# Patient Record
Sex: Female | Born: 1985 | Race: Black or African American | Hispanic: No | Marital: Single | Smoking: Never smoker
Health system: Southern US, Community
[De-identification: ages and names within clinical notes are randomized; demographics above are authoritative.]

## PROBLEM LIST (undated history)

## (undated) HISTORY — PX: WISDOM TOOTH EXTRACTION: SHX21

## (undated) HISTORY — PX: NO PAST SURGERIES: SHX2092

---
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
## 2008-07-01 ENCOUNTER — Emergency Department: Payer: Self-pay | Admitting: Emergency Medicine

## 2009-05-02 ENCOUNTER — Observation Stay: Payer: Self-pay

## 2009-05-03 ENCOUNTER — Ambulatory Visit: Payer: Self-pay

## 2009-07-24 ENCOUNTER — Observation Stay: Payer: Self-pay

## 2009-08-08 ENCOUNTER — Inpatient Hospital Stay: Payer: Self-pay

## 2017-06-26 ENCOUNTER — Ambulatory Visit
Admission: EM | Admit: 2017-06-26 | Discharge: 2017-06-26 | Disposition: A | Discharge status: Home / Self Care | Payer: BLUE CROSS/BLUE SHIELD | Attending: Family Medicine | Admitting: Family Medicine

## 2017-06-26 DIAGNOSIS — N3090 Cystitis, unspecified without hematuria: Secondary | ICD-10-CM | POA: Diagnosis not present

## 2017-06-26 DIAGNOSIS — N390 Urinary tract infection, site not specified: Secondary | ICD-10-CM

## 2017-06-26 LAB — URINALYSIS, COMPLETE (UACMP) WITH MICROSCOPIC
BILIRUBIN URINE: NEGATIVE
Glucose, UA: NEGATIVE mg/dL
HGB URINE DIPSTICK: NEGATIVE
KETONES UR: NEGATIVE mg/dL
NITRITE: NEGATIVE
PH: 6 (ref 5.0–8.0)
Protein, ur: NEGATIVE mg/dL
Specific Gravity, Urine: <1.005 — ABNORMAL LOW (ref 1.005–1.030)

## 2017-06-26 MED ORDER — PHENAZOPYRIDINE HCL 200 MG PO TABS: 200.0000 mg | ORAL_TABLET | Freq: Three times a day (TID) | ORAL | 0 refills | Status: DC | PRN

## 2017-06-26 MED ORDER — FLUCONAZOLE 150 MG PO TABS: 150.0000 mg | ORAL_TABLET | Freq: Once | ORAL | 0 refills | Status: AC

## 2017-06-26 MED ORDER — NITROFURANTOIN MONOHYD MACRO 100 MG PO CAPS: 100.0000 mg | ORAL_CAPSULE | Freq: Two times a day (BID) | ORAL | 0 refills | Status: DC

## 2017-06-26 NOTE — ED Provider Notes (Signed)
[facility]    CSN: [id number] Arrival date & time: 06/26/17  0802     History   Chief Complaint Chief Complaint  Patient presents with  . Urinary Frequency    HPI Alisha Morales is a 32 y.o. female.   Patient is a 32 year old black female also having dysuria no abdominal pain since Friday. She reports that years ago she was getting UTIs quite often but then she hadn't had any trouble until recently. She went to GYN on Monday and was cleared for chlamydia and GC and any other type of significant vaginal irritation and inflammation but did not check her urine. She reports some burning urination and frequency. Does not smoke. No known drug allergies habits no significant past family medical history other than hypertension. No previous surgeries.   The history is provided by the patient. No language interpreter was used.  Urinary Frequency  This is a new problem. The current episode started more than 2 days ago. The problem occurs constantly. The problem has been gradually worsening. Pertinent negatives include no chest pain, no abdominal pain, no headaches and no shortness of breath. Nothing aggravates the symptoms. Nothing relieves the symptoms. She has tried nothing for the symptoms. The treatment provided no relief.    History reviewed. No pertinent past medical history.  There are no active problems to display for this patient.   Past Surgical History:  Procedure Laterality Date  . NO PAST SURGERIES      OB History    No data available       Home Medications    Prior to Admission medications   Not on File    Family History History reviewed. No pertinent family history.  Social History Social History  Substance Use Topics  . Smoking status: Never Smoker  . Smokeless tobacco: Never Used  . Alcohol use No     Allergies   Patient has no known allergies.   Review of Systems Review of Systems  Respiratory: Negative for shortness of breath.    Cardiovascular: Negative for chest pain.  Gastrointestinal: Negative for abdominal pain.  Genitourinary: Positive for dysuria and frequency.  Neurological: Negative for headaches.  All other systems reviewed and are negative.    Physical Exam Triage Vital Signs ED Triage Vitals  Enc Vitals Group     BP 06/26/17 0820 (!) 144/86     Pulse Rate 06/26/17 0820 98     Resp 06/26/17 0820 18     Temp 06/26/17 0820 98.7 F (37.1 C)     Temp Source 06/26/17 0820 Oral     SpO2 06/26/17 0820 100 %     Weight 06/26/17 0817 250 lb (113.4 kg)     Height 06/26/17 0817 5\' 2"  (1.575 m)     Head Circumference --      Peak Flow --      Pain Score 06/26/17 0817 5     Pain Loc --      Pain Edu? --      Excl. in GC? --    No data found.   Updated Vital Signs BP (!) 144/86 (BP Location: Left Arm)   Pulse 98   Temp 98.7 F (37.1 C) (Oral)   Resp 18   Ht 5\' 2"  (1.575 m)   Wt 250 lb (113.4 kg)   LMP 06/11/2017   SpO2 100%   BMI 45.73 kg/m   Visual Acuity Right Eye Distance:   Left Eye Distance:   Bilateral Distance:  Right Eye Near:   Left Eye Near:    Bilateral Near:     Physical Exam  Constitutional: She is oriented to person, place, and time. She appears well-developed and well-nourished. No distress.  HENT:  Head: Normocephalic and atraumatic.  Right Ear: External ear normal.  Left Ear: External ear normal.  Eyes: Pupils are equal, round, and reactive to light.  Neck: Normal range of motion.  Pulmonary/Chest: Effort normal.  Abdominal: Soft. Bowel sounds are normal. She exhibits no distension. There is no tenderness.  Musculoskeletal: Normal range of motion.  Neurological: She is alert and oriented to person, place, and time.  Skin: Skin is warm. She is not diaphoretic.     UC Treatments / Results  Labs (all labs ordered are listed, but only abnormal results are displayed) Labs Reviewed  URINALYSIS, COMPLETE (UACMP) WITH MICROSCOPIC - Abnormal; Notable for the  following:       Result Value   Specific Gravity, Urine <1.005 (*)    Leukocytes, UA TRACE (*)    Squamous Epithelial / LPF 0-5 (*)    Bacteria, UA FEW (*)    All other components within normal limits    EKG  EKG Interpretation None       Radiology No results found.  Procedures Procedures (including critical care time)  Medications Ordered in UC Medications - No data to display  Results for orders placed or performed during the hospital encounter of 06/26/17  Urinalysis, Complete w Microscopic  Result Value Ref Range   Color, Urine YELLOW YELLOW   APPearance CLEAR CLEAR   Specific Gravity, Urine <1.005 (L) 1.005 - 1.030   pH 6.0 5.0 - 8.0   Glucose, UA NEGATIVE NEGATIVE mg/dL   Hgb urine dipstick NEGATIVE NEGATIVE   Bilirubin Urine NEGATIVE NEGATIVE   Ketones, ur NEGATIVE NEGATIVE mg/dL   Protein, ur NEGATIVE NEGATIVE mg/dL   Nitrite NEGATIVE NEGATIVE   Leukocytes, UA TRACE (A) NEGATIVE   Squamous Epithelial / LPF 0-5 (A) NONE SEEN   WBC, UA 6-30 0 - 5 WBC/hpf   RBC / HPF 0-5 0 - 5 RBC/hpf   Bacteria, UA FEW (A) NONE SEEN    Initial Impression / Assessment and Plan / UC Course  I have reviewed the triage vital signs and the nursing notes.  Pertinent labs & imaging results that were available during my care of the patient were reviewed by me and considered in my medical decision making (see chart for details).   we'll place her on Macrobid 100 mg twice a day Diflucan if needed for yeast Pyridium 200 mg 2 times a day for the next 5 days she declines a work note pop PCP as needed  Final Clinical Impressions(s) / UC Diagnoses   Final diagnoses:  Lower urinary tract infectious disease  Cystitis    New Prescriptions New Prescriptions   No medications on file     Note: This dictation was prepared with Dragon dictation along with smaller phrase technology. Any transcriptional errors that result from this process are unintentional.   Hassan Rowan, MD 06/26/17  [phone]

## 2017-06-26 NOTE — ED Triage Notes (Signed)
Patient complains of urgency with output, burning with urination. Patient states that she went to gyn on Monday and was tested for yeast and stds and all was normal. Patient reports that they did not test her urine and she has been having symptoms since Friday.

## 2017-06-28 LAB — URINE CULTURE
Culture: 40000 — AB
Special Requests: NORMAL

## 2017-07-22 ENCOUNTER — Encounter: Payer: Self-pay | Admitting: Emergency Medicine

## 2017-07-22 ENCOUNTER — Ambulatory Visit
Admission: EM | Admit: 2017-07-22 | Discharge: 2017-07-22 | Disposition: A | Discharge status: Home / Self Care | Payer: BLUE CROSS/BLUE SHIELD | Attending: Family Medicine | Admitting: Family Medicine

## 2017-07-22 DIAGNOSIS — R109 Unspecified abdominal pain: Secondary | ICD-10-CM | POA: Diagnosis not present

## 2017-07-22 DIAGNOSIS — R197 Diarrhea, unspecified: Secondary | ICD-10-CM | POA: Diagnosis not present

## 2017-07-22 MED ORDER — METRONIDAZOLE 500 MG PO TABS: 500.0000 mg | ORAL_TABLET | Freq: Three times a day (TID) | ORAL | 0 refills | Status: DC

## 2017-07-22 NOTE — ED Provider Notes (Signed)
[facility]    CSN: [id number] Arrival date & time: 07/22/17  1403     History   Chief Complaint Chief Complaint  Patient presents with  . Abdominal Pain  . Diarrhea    HPI Alisha Morales is a 32 y.o. female.   32 yo female with a c/o watery, mucousy,  diarrhea and abdominal cramping for 2 weeks. States symptoms started after she finished an antibiotic course for a UTI. Denies any fevers, chills, vomiting, nausea.    The history is provided by the patient.  Abdominal Pain  Associated symptoms: diarrhea   Diarrhea  Associated symptoms: abdominal pain     History reviewed. No pertinent past medical history.  There are no active problems to display for this patient.   Past Surgical History:  Procedure Laterality Date  . NO PAST SURGERIES      OB History    No data available       Home Medications    Prior to Admission medications   Medication Sig Start Date End Date Taking? Authorizing Provider  metroNIDAZOLE (FLAGYL) 500 MG tablet Take 1 tablet (500 mg total) by mouth 3 (three) times daily. 07/22/17   Payton Mccallum, MD    Family History History reviewed. No pertinent family history.  Social History Social History  Substance Use Topics  . Smoking status: Never Smoker  . Smokeless tobacco: Never Used  . Alcohol use No     Allergies   Patient has no known allergies.   Review of Systems Review of Systems  Gastrointestinal: Positive for abdominal pain and diarrhea.     Physical Exam Triage Vital Signs ED Triage Vitals  Enc Vitals Group     BP 07/22/17 1413 137/82     Pulse Rate 07/22/17 1413 (!) 105     Resp 07/22/17 1413 18     Temp 07/22/17 1413 98.2 F (36.8 C)     Temp Source 07/22/17 1413 Oral     SpO2 07/22/17 1413 100 %     Weight 07/22/17 1411 250 lb (113.4 kg)     Height 07/22/17 1411 5\' 2"  (1.575 m)     Head Circumference --      Peak Flow --      Pain Score 07/22/17 1411 3     Pain Loc --      Pain Edu? --        Excl. in GC? --    No data found.   Updated Vital Signs BP 137/82 (BP Location: Left Arm)   Pulse (!) 105   Temp 98.2 F (36.8 C) (Oral)   Resp 18   Ht 5\' 2"  (1.575 m)   Wt 250 lb (113.4 kg)   LMP 07/02/2017 (Exact Date)   SpO2 100%   BMI 45.73 kg/m   Visual Acuity Right Eye Distance:   Left Eye Distance:   Bilateral Distance:    Right Eye Near:   Left Eye Near:    Bilateral Near:     Physical Exam  Constitutional: She appears well-developed and well-nourished. No distress.  Pulmonary/Chest: Effort normal. No respiratory distress.  Abdominal: Soft. Bowel sounds are normal. She exhibits no distension and no mass. There is no tenderness. There is no rebound and no guarding.  Skin: She is not diaphoretic.  Nursing note and vitals reviewed.    UC Treatments / Results  Labs (all labs ordered are listed, but only abnormal results are displayed) Labs Reviewed - No data to display  EKG  EKG Interpretation None       Radiology No results found.  Procedures Procedures (including critical care time)  Medications Ordered in UC Medications - No data to display   Initial Impression / Assessment and Plan / UC Course  I have reviewed the triage vital signs and the nursing notes.  Pertinent labs & imaging results that were available during my care of the patient were reviewed by me and considered in my medical decision making (see chart for details).       Final Clinical Impressions(s) / UC Diagnoses   Final diagnoses:  Diarrhea, unspecified type    New Prescriptions Discharge Medication List as of 07/22/2017  2:49 PM    START taking these medications   Details  metroNIDAZOLE (FLAGYL) 500 MG tablet Take 1 tablet (500 mg total) by mouth 3 (three) times daily., Starting Mon 07/22/2017, Normal       1. diagnosis reviewed with patient 2. rx as per orders above; reviewed possible side effects, interactions, risks and benefits  3. Recommend supportive  treatment with clear liquids, then advance diet slowly as tolerated 4. Follow-up prn if symptoms worsen or don't improve   Payton Mccallumonty, Neosha Switalski, MD 07/22/17 [phone]

## 2017-07-22 NOTE — ED Triage Notes (Signed)
Patient c/o mid abdominal pain and diarrhea for the past 2 weeks.  Patient denies nausea or vomiting.

## 2017-07-22 NOTE — Discharge Instructions (Signed)
Clear liquids then advance diet slowly

## 2017-07-24 ENCOUNTER — Telehealth: Payer: Self-pay | Admitting: *Deleted

## 2017-07-24 MED ORDER — FLUCONAZOLE 150 MG PO TABS: 150.0000 mg | ORAL_TABLET | Freq: Once | ORAL | 0 refills | Status: AC

## 2017-07-25 ENCOUNTER — Encounter: Payer: Self-pay | Admitting: Emergency Medicine

## 2017-07-25 ENCOUNTER — Ambulatory Visit
Admission: EM | Admit: 2017-07-25 | Discharge: 2017-07-25 | Disposition: A | Discharge status: Home / Self Care | Payer: BLUE CROSS/BLUE SHIELD | Attending: Emergency Medicine | Admitting: Emergency Medicine

## 2017-07-25 DIAGNOSIS — Z113 Encounter for screening for infections with a predominantly sexual mode of transmission: Secondary | ICD-10-CM | POA: Diagnosis not present

## 2017-07-25 DIAGNOSIS — R300 Dysuria: Secondary | ICD-10-CM | POA: Diagnosis not present

## 2017-07-25 DIAGNOSIS — N898 Other specified noninflammatory disorders of vagina: Secondary | ICD-10-CM

## 2017-07-25 DIAGNOSIS — N760 Acute vaginitis: Secondary | ICD-10-CM | POA: Diagnosis not present

## 2017-07-25 DIAGNOSIS — L298 Other pruritus: Secondary | ICD-10-CM | POA: Diagnosis not present

## 2017-07-25 LAB — URINALYSIS, COMPLETE (UACMP) WITH MICROSCOPIC
Bilirubin Urine: NEGATIVE
GLUCOSE, UA: NEGATIVE mg/dL
Hgb urine dipstick: NEGATIVE
Ketones, ur: NEGATIVE mg/dL
Nitrite: NEGATIVE
PH: 6 (ref 5.0–8.0)
Protein, ur: NEGATIVE mg/dL
Specific Gravity, Urine: 1.015 (ref 1.005–1.030)

## 2017-07-25 LAB — WET PREP, GENITAL
CLUE CELLS WET PREP: NONE SEEN
SPERM: NONE SEEN
Trich, Wet Prep: NONE SEEN
Yeast Wet Prep HPF POC: NONE SEEN

## 2017-07-25 LAB — CHLAMYDIA/NGC RT PCR (ARMC ONLY)
CHLAMYDIA TR: NOT DETECTED
N GONORRHOEAE: NOT DETECTED

## 2017-07-25 MED ORDER — NYSTATIN-TRIAMCINOLONE 100000-0.1 UNIT/GM-% EX CREA: TOPICAL_CREAM | CUTANEOUS | 0 refills | Status: DC

## 2017-07-25 MED ORDER — FLUCONAZOLE 150 MG PO TABS: 150.0000 mg | ORAL_TABLET | Freq: Once | ORAL | 1 refills | Status: AC

## 2017-07-25 NOTE — ED Triage Notes (Signed)
Patient c/o vaginal itching and discharge for the past 3 days.

## 2017-07-25 NOTE — ED Provider Notes (Signed)
HPI  SUBJECTIVE:  Alisha ChihuahuaShannah L Cousin is a 32 y.o. female who presents with 3 days of vaginal itching, burning starting the same day that she was started on Flagyl for diarrhea. She reports nonodorous yellow vaginal discharge, dysuria. She had Diflucan called in Yesterday and states that she took it without improvement in her symptoms. She also tried Vagisil anti-itch cream which is 20% benzocaine with improvement in symptoms. Symptoms are worse with urination. No nausea, vomiting, fevers, genital rash, odor, pelvic, abdominal, back pain. She is in a monogamous relationship with a female who is asymptomatic, but patient is requesting testing for all STDs today. No dyspareunia. She is using [place] lavender soap, which is new. She has a past medical history Chlamydia, BV, yeast. No history of diabetes, hypertension, gonorrhea, HIV, HSV, syphilis, Trichomonas. LMP: 6/9. States that we do not need to check for pregnancy. [facility]   Patient was seen on 7/23 for 2 week bout of diarrhea after finishing an antibiotic course for UTI. Sent home with Flagyl. States she is on day #3 of this.    Past Surgical History:  Procedure Laterality Date  . NO PAST SURGERIES      History reviewed. No pertinent family history.  Social History  Substance Use Topics  . Smoking status: Never Smoker  . Smokeless tobacco: Never Used  . Alcohol use No    No current facility-administered medications for this encounter.   Current Outpatient Prescriptions:  .  fluconazole (DIFLUCAN) 150 MG tablet, Take 1 tablet (150 mg total) by mouth once. 1 tab po x 1. May repeat in 72 hours if no improvement, Disp: 2 tablet, Rfl: 1 .  metroNIDAZOLE (FLAGYL) 500 MG tablet, Take 1 tablet (500 mg total) by mouth 3 (three) times daily., Disp: 30 tablet, Rfl: 0 .  nystatin-triamcinolone (MYCOLOG II) cream, Apply to affected area daily, Disp: 15 g, Rfl: 0  No Known Allergies   ROS  As noted in HPI.   Physical  Exam  BP (!) 141/89 (BP Location: Left Arm)   Pulse 92   Temp 98.6 F (37 C) (Oral)   Resp 16   Ht 5\' 2"  (1.575 m)   Wt 250 lb (113.4 kg)   LMP 07/02/2017 (Exact Date)   SpO2 100%   BMI 45.73 kg/m   Constitutional: Well developed, well nourished, no acute distress Eyes:  EOMI, conjunctiva normal bilaterally HENT: Normocephalic, atraumatic,mucus membranes moist Respiratory: Normal inspiratory effort Cardiovascular: Normal rate GI: nondistended soft, nontender. No suprapubic tenderness  back: No CVA tenderness GU: External genitalia normal. No blisters.  Normal vaginal mucosa.  Normal os. Thin nonoderous  Yellowish vaginal discharge.  Uterus smooth, NT. No  CMT. No  adnexal tenderness. No adnexal masses.  Chaperone present during exam skin: No rash, skin intact Musculoskeletal: no deformities Neurologic: Alert & oriented x 3, no focal neuro deficits Psychiatric: Speech and behavior appropriate   ED Course   Medications - No data to display  Orders Placed This Encounter  Procedures  . Chlamydia/NGC rt PCR    Standing Status:   Standing    Number of Occurrences:   1    Order Specific Question:   Patient immune status    Answer:   Normal  . Wet prep, genital    Standing Status:   Standing    Number of Occurrences:   1    Order Specific Question:   Patient immune status    Answer:   Normal  . Urine  culture    Standing Status:   Standing    Number of Occurrences:   1    Order Specific Question:   Patient immune status    Answer:   Normal  . RPR    Standing Status:   Standing    Number of Occurrences:   1  . HIV antibody    Standing Status:   Standing    Number of Occurrences:   1  . Urinalysis, Complete w Microscopic    Standing Status:   Standing    Number of Occurrences:   1    Results for orders placed or performed during the hospital encounter of 07/25/17 (from the past 24 hour(s))  Urinalysis, Complete w Microscopic     Status: Abnormal   Collection Time:  07/25/17  3:16 PM  Result Value Ref Range   Color, Urine YELLOW YELLOW   APPearance CLEAR CLEAR   Specific Gravity, Urine 1.015 1.005 - 1.030   pH 6.0 5.0 - 8.0   Glucose, UA NEGATIVE NEGATIVE mg/dL   Hgb urine dipstick NEGATIVE NEGATIVE   Bilirubin Urine NEGATIVE NEGATIVE   Ketones, ur NEGATIVE NEGATIVE mg/dL   Protein, ur NEGATIVE NEGATIVE mg/dL   Nitrite NEGATIVE NEGATIVE   Leukocytes, UA SMALL (A) NEGATIVE   Squamous Epithelial / LPF 6-30 (A) NONE SEEN   WBC, UA 6-30 0 - 5 WBC/hpf   RBC / HPF 0-5 0 - 5 RBC/hpf   Bacteria, UA FEW (A) NONE SEEN  Wet prep, genital     Status: Abnormal   Collection Time: 07/25/17  3:16 PM  Result Value Ref Range   Yeast Wet Prep HPF POC NONE SEEN NONE SEEN   Trich, Wet Prep NONE SEEN NONE SEEN   Clue Cells Wet Prep HPF POC NONE SEEN NONE SEEN   WBC, Wet Prep HPF POC TOO NUMEROUS TO COUNT (A) NONE SEEN   Sperm NONE SEEN    No results found.  ED Clinical Impression  Vaginal itching  Acute vaginitis   ED Assessment/Plan  No previous labs available.  No evidence of herpes on today's exam although this is in the differential. Think that this is most likely a yeast infection or could be a contact irritant dermatitis from the new soap.  UA, wet prep for today,    UA contaminated specimen, but she has small leukocytes, feel that her dysuria is more from vulvar irritation rather than a true UTI. We'll send this off for culture to confirm absence of UTI.  Wet prep positive for many WBCs, no BV, yeast, Trichomonas.  Despite wet prep results, due to the recent antibiotic and the itching, we will send her home with nystatin/triamcinolone Mycolog cream in addition to another Diflucan, may repeat 72 hours if no better. She will finish the Flagyl.  Sent off GC/chlamydia,  HIV, RPR. Will not treat empirically now due to recent diarrhea from antibiotics. Advised pt to refrain from sexual contact until she knows lab results, symptoms resolve, and  partner(s) are treated if necessary. Pt provided working phone number. Follow-up with PMD as needed. Discussed labs, MDM, plan and followup with patient. Pt agrees with plan.   Meds ordered this encounter  Medications  . fluconazole (DIFLUCAN) 150 MG tablet    Sig: Take 1 tablet (150 mg total) by mouth once. 1 tab po x 1. May repeat in 72 hours if no improvement    Dispense:  2 tablet    Refill:  1  . nystatin-triamcinolone (MYCOLOG II)  cream    Sig: Apply to affected area daily    Dispense:  15 g    Refill:  0    *This clinic note was created using Scientist, clinical (histocompatibility and immunogenetics)Dragon dictation software. Therefore, there may be occasional mistakes despite careful proofreading.  ?    Domenick GongMortenson, Raygen Dahm, MD 07/25/17 [phone]

## 2017-07-25 NOTE — Discharge Instructions (Signed)
Take the medication as written. Give us a working phone number so that we can contact you if needed. Refrain from sexual contact until you know your results and your partner(s) are treated if necessary.  Return if you get worse, have a fever >100.4, or for any concerns.   Go to www.goodrx.com to look up your medications. This will give you a list of where you can find your prescriptions at the most affordable prices. Or ask the pharmacist what the cash price is, or if they have any other discount programs available to help make your medication more affordable. This can be less expensive than what you would pay with insurance.

## 2017-07-26 LAB — HIV ANTIBODY (ROUTINE TESTING W REFLEX): HIV SCREEN 4TH GENERATION: NONREACTIVE

## 2017-07-26 LAB — RPR: RPR: NONREACTIVE

## 2017-07-27 LAB — URINE CULTURE: SPECIAL REQUESTS: NORMAL

## 2017-11-14 ENCOUNTER — Ambulatory Visit
Admission: EM | Admit: 2017-11-14 | Discharge: 2017-11-14 | Disposition: A | Discharge status: Home / Self Care | Payer: BLUE CROSS/BLUE SHIELD | Attending: Family Medicine | Admitting: Family Medicine

## 2017-11-14 ENCOUNTER — Other Ambulatory Visit: Payer: Self-pay

## 2017-11-14 DIAGNOSIS — R319 Hematuria, unspecified: Secondary | ICD-10-CM

## 2017-11-14 DIAGNOSIS — R350 Frequency of micturition: Secondary | ICD-10-CM

## 2017-11-14 DIAGNOSIS — R300 Dysuria: Secondary | ICD-10-CM

## 2017-11-14 DIAGNOSIS — N390 Urinary tract infection, site not specified: Secondary | ICD-10-CM

## 2017-11-14 LAB — URINALYSIS, COMPLETE (UACMP) WITH MICROSCOPIC
BACTERIA UA: NONE SEEN
Bilirubin Urine: NEGATIVE
Glucose, UA: NEGATIVE mg/dL
Ketones, ur: NEGATIVE mg/dL
NITRITE: NEGATIVE
PH: 6 (ref 5.0–8.0)
Protein, ur: NEGATIVE mg/dL
RBC / HPF: NONE SEEN RBC/hpf (ref 0–5)

## 2017-11-14 MED ORDER — SULFAMETHOXAZOLE-TRIMETHOPRIM 800-160 MG PO TABS: 1.0000 | ORAL_TABLET | Freq: Two times a day (BID) | ORAL | 0 refills | Status: AC

## 2017-11-14 MED ORDER — FLUCONAZOLE 200 MG PO TABS: 200.0000 mg | ORAL_TABLET | Freq: Once | ORAL | 0 refills | Status: AC

## 2017-11-14 NOTE — ED Provider Notes (Signed)
[facility]    CSN: [id number] Arrival date & time: 11/14/17  1532     History   Chief Complaint Chief Complaint  Patient presents with  . Urinary Frequency    HPI Alisha Morales is a 32 y.o. female.   Patient is a 32 year old female who presents with complaint of burning with urination, frequency, urgency, and some pink coloration to her urine that started yesterday. Patient reports burning his 20 in the upper urination. She denies fever chills. She is not taking medications for this. Patient did have a UTI back in July that was treated with Macrobid which cause some diarrhea afterwards,      History reviewed. No pertinent past medical history.  There are no active problems to display for this patient.   Past Surgical History:  Procedure Laterality Date  . NO PAST SURGERIES      OB History    No data available       Home Medications    Prior to Admission medications   Medication Sig Start Date End Date Taking? Authorizing Provider  fluconazole (DIFLUCAN) 200 MG tablet Take 1 tablet (200 mg total) once for 1 dose by mouth. 11/14/17 11/14/17  Candis SchatzHarris, Yeslin Delio D, PA-C  sulfamethoxazole-trimethoprim (BACTRIM DS,SEPTRA DS) 800-160 MG tablet Take 1 tablet 2 (two) times daily for 5 days by mouth. 11/14/17 11/19/17  Candis SchatzHarris, Donnica Jarnagin D, PA-C    Family History Family History  Problem Relation Age of Onset  . Stroke Mother   . Diabetes Father   . Hypercalcemia Father   . Hypertension Father     Social History Social History   Tobacco Use  . Smoking status: Never Smoker  . Smokeless tobacco: Never Used  Substance Use Topics  . Alcohol use: No  . Drug use: No     Allergies   Patient has no known allergies.   Review of Systems Review of Systems  -As noted above in history of present illness. Other systems reviewed and found to be negative.   Physical Exam Triage Vital Signs ED Triage Vitals  Enc Vitals Group     BP 11/14/17 1610 (!)  144/99     Pulse Rate 11/14/17 1610 77     Resp 11/14/17 1610 18     Temp 11/14/17 1610 98.7 F (37.1 C)     Temp Source 11/14/17 1610 Oral     SpO2 11/14/17 1610 100 %     Weight 11/14/17 1606 250 lb (113.4 kg)     Height 11/14/17 1606 5\' 2"  (1.575 m)     Head Circumference --      Peak Flow --      Pain Score 11/14/17 1606 9     Pain Loc --      Pain Edu? --      Excl. in GC? --    No data found.  Updated Vital Signs BP (!) 144/99 (BP Location: Left Arm)   Pulse 77   Temp 98.7 F (37.1 C) (Oral)   Resp 18   Ht 5\' 2"  (1.575 m)   Wt 250 lb (113.4 kg)   LMP 10/25/2017   SpO2 100%   BMI 45.73 kg/m   Visual Acuity Right Eye Distance:   Left Eye Distance:   Bilateral Distance:    Right Eye Near:   Left Eye Near:    Bilateral Near:     Physical Exam  Constitutional: She is oriented to person, place, and time. She appears well-developed and well-nourished.  No distress.  HENT:  Head: Normocephalic and atraumatic.  Eyes: EOM are normal. Pupils are equal, round, and reactive to light.  Neck: Normal range of motion. Neck supple.  Cardiovascular: Normal rate, regular rhythm and intact distal pulses. Exam reveals no friction rub.  No murmur heard. Pulmonary/Chest: Breath sounds normal. No respiratory distress.  Abdominal: Bowel sounds are normal. There is no tenderness (no CVA tenderness).  Musculoskeletal: Normal range of motion.  Neurological: She is alert and oriented to person, place, and time. A cranial nerve deficit is present.  Skin: Skin is warm and dry.     UC Treatments / Results  Labs (all labs ordered are listed, but only abnormal results are displayed) Labs Reviewed  URINALYSIS, COMPLETE (UACMP) WITH MICROSCOPIC - Abnormal; Notable for the following components:      Result Value   Specific Gravity, Urine <1.005 (*)    Hgb urine dipstick TRACE (*)    Leukocytes, UA TRACE (*)    Squamous Epithelial / LPF 0-5 (*)    All other components within normal  limits  URINE CULTURE    EKG  EKG Interpretation None       Radiology No results found.  Procedures Procedures (including critical care time)  Medications Ordered in UC Medications - No data to display   Initial Impression / Assessment and Plan / UC Course  I have reviewed the triage vital signs and the nursing notes.  Pertinent labs & imaging results that were available during my care of the patient were reviewed by me and considered in my medical decision making (see chart for details).    UA with possible UTI. Will place her on a prescription of Bactrim. We'll send a culture. Also give her Diflucan that she can take as needed. Also recommend her start taking a probiotic based on her diarrhea after Macrobid earlier this summer.  Final Clinical Impressions(s) / UC Diagnoses   Final diagnoses:  Urinary tract infection with hematuria, site unspecified    ED Discharge Orders        Ordered    sulfamethoxazole-trimethoprim (BACTRIM DS,SEPTRA DS) 800-160 MG tablet  2 times daily     11/14/17 1647    fluconazole (DIFLUCAN) 200 MG tablet   Once     11/14/17 1649       Controlled Substance Prescriptions [place] Controlled Substance Registry consulted? Not Applicable   Candis SchatzHarris, Soriya Worster D, PA-C 11/14/17 1650

## 2017-11-14 NOTE — Discharge Instructions (Signed)
-  bactrim: one tablet twice a day for 5 days -Diflucan: one tablet once if needed for yeast infection -push fluids -given diarrhea with previous antibiotic, recommend over the counter probiotic to help avoid the diarrhea -return to clinic if symptoms worsen or not improve

## 2017-11-14 NOTE — ED Triage Notes (Signed)
Patient complains of urinary urgency, frequency, burning with urination, hematuria x yesterday.

## 2020-11-14 ENCOUNTER — Inpatient Hospital Stay: Payer: BC Managed Care – PPO

## 2020-11-14 ENCOUNTER — Inpatient Hospital Stay: Payer: BC Managed Care – PPO | Admitting: Oncology

## 2020-11-14 NOTE — Progress Notes (Deleted)
   Hematology/Oncology Consult note [facility] [phone] [phone]  Patient Care Team: Inc, [facility] as PCP - General   Name of the patient: Alisha Morales  [id number]  [birth year]    Reason for referral-iron deficiency anemia   Referring physician-Dr. Larwance Sachs  Date of visit: 11/14/20   History of presenting illness- ***  ECOG PS- ***  Pain scale- ***   Review of systems- ROS  No Known Allergies  There are no problems to display for this patient.    No past medical history on file.   Past Surgical History:  Procedure Laterality Date  . NO PAST SURGERIES      Social History   Socioeconomic History  . Marital status: Single    Spouse name: Not on file  . Number of children: Not on file  . Years of education: Not on file  . Highest education level: Not on file  Occupational History  . Not on file  Tobacco Use  . Smoking status: Never Smoker  . Smokeless tobacco: Never Used  Vaping Use  . Vaping Use: Never used  Substance and Sexual Activity  . Alcohol use: No  . Drug use: No  . Sexual activity: Not on file  Other Topics Concern  . Not on file  Social History Narrative  . Not on file   Social Determinants of Health   Financial Resource Strain:   . Difficulty of Paying Living Expenses: Not on file  Food Insecurity:   . Worried About Programme researcher, broadcasting/film/video in the Last Year: Not on file  . Ran Out of Food in the Last Year: Not on file  Transportation Needs:   . Lack of Transportation (Medical): Not on file  . Lack of Transportation (Non-Medical): Not on file  Physical Activity:   . Days of Exercise per Week: Not on file  . Minutes of Exercise per Session: Not on file  Stress:   . Feeling of Stress : Not on file  Social Connections:   . Frequency of Communication with Friends and Family: Not on file  . Frequency of Social Gatherings with Friends and Family: Not on file  . Attends  Religious Services: Not on file  . Active Member of Clubs or Organizations: Not on file  . Attends Banker Meetings: Not on file  . Marital Status: Not on file  Intimate Partner Violence:   . Fear of Current or Ex-Partner: Not on file  . Emotionally Abused: Not on file  . Physically Abused: Not on file  . Sexually Abused: Not on file     Family History  Problem Relation Age of Onset  . Stroke Mother   . Diabetes Father   . Hypercalcemia Father   . Hypertension Father     No current outpatient medications on file.   Physical exam: There were no vitals filed for this visit. Physical Exam     No flowsheet data found. No flowsheet data found.  No images are attached to the encounter.  No results found.  Assessment and plan- Patient is a 35 y.o. female ***   Thank you for this kind referral and the opportunity to participate in the care of this  Patient   Visit Diagnosis No diagnosis found.  Dr. Owens Shark, MD, MPH [facility] [phone] 11/14/2020

## 2021-07-02 ENCOUNTER — Encounter: Payer: Self-pay | Admitting: Emergency Medicine

## 2021-07-02 ENCOUNTER — Other Ambulatory Visit: Payer: Self-pay

## 2021-07-02 ENCOUNTER — Emergency Department
Admission: EM | Admit: 2021-07-02 | Discharge: 2021-07-02 | Disposition: A | Discharge status: Home / Self Care | Payer: BC Managed Care – PPO | Attending: Emergency Medicine | Admitting: Emergency Medicine

## 2021-07-02 DIAGNOSIS — H66001 Acute suppurative otitis media without spontaneous rupture of ear drum, right ear: Secondary | ICD-10-CM | POA: Insufficient documentation

## 2021-07-02 DIAGNOSIS — I10 Essential (primary) hypertension: Secondary | ICD-10-CM | POA: Insufficient documentation

## 2021-07-02 DIAGNOSIS — H9201 Otalgia, right ear: Secondary | ICD-10-CM | POA: Diagnosis present

## 2021-07-02 MED ORDER — IBUPROFEN 800 MG PO TABS
800.0000 mg | ORAL_TABLET | Freq: Once | ORAL | Status: DC
  Filled 2021-07-02: qty 1

## 2021-07-02 MED ORDER — NAPROXEN 500 MG PO TABS: 500.0000 mg | ORAL_TABLET | Freq: Two times a day (BID) | ORAL | 0 refills | Status: DC

## 2021-07-02 MED ORDER — AMOXICILLIN-POT CLAVULANATE 875-125 MG PO TABS: 1.0000 | ORAL_TABLET | Freq: Two times a day (BID) | ORAL | 0 refills | Status: AC

## 2021-07-02 MED ORDER — HYDROCODONE-ACETAMINOPHEN 5-325 MG PO TABS: 1.0000 | ORAL_TABLET | Freq: Four times a day (QID) | ORAL | 0 refills | Status: AC | PRN

## 2021-07-02 NOTE — ED Triage Notes (Signed)
Pt comes into the ED via POV c/o right ear pain that started this morning.  Pt does admit that she had cold like symptoms earlier in the week but those have cleared up now.  PT in NAD at this time.

## 2021-07-02 NOTE — ED Notes (Signed)
Pt verbalizes understanding of d/c instructions, medications and follow up 

## 2021-07-02 NOTE — ED Provider Notes (Signed)
[facility] Emergency Department Provider Note ____________________________________________  Time seen: Approximately 7:37 AM  I have reviewed the triage vital signs and the nursing notes.   HISTORY  Chief Complaint Otalgia    HPI Alisha Morales is a 36 y.o. female presents to the emergency department for treatment and evaluation of otalgia that started early this morning.  She had had some cold symptoms that have resolved completely but has developed severe right ear pain.  No known fever.  No alleviating measures attempted prior to arrival. History reviewed. No pertinent past medical history.  There are no problems to display for this patient.   Past Surgical History:  Procedure Laterality Date   NO PAST SURGERIES      Prior to Admission medications   Medication Sig Start Date End Date Taking? Authorizing Provider  amoxicillin-clavulanate (AUGMENTIN) 875-125 MG tablet Take 1 tablet by mouth 2 (two) times daily for 10 days. 07/02/21 07/12/21 Yes Edyn Qazi B, FNP  HYDROcodone-acetaminophen (NORCO/VICODIN) 5-325 MG tablet Take 1 tablet by mouth every 6 (six) hours as needed for up to 3 days for severe pain. 07/02/21 07/05/21 Yes Azaylea Maves B, FNP  naproxen (NAPROSYN) 500 MG tablet Take 1 tablet (500 mg total) by mouth 2 (two) times daily with a meal. 07/02/21  Yes Fredrick Geoghegan B, FNP    Allergies Patient has no known allergies.  Family History  Problem Relation Age of Onset   Stroke Mother    Diabetes Father    Hypercalcemia Father    Hypertension Father     Social History Social History   Tobacco Use   Smoking status: Never   Smokeless tobacco: Never  Vaping Use   Vaping Use: Never used  Substance Use Topics   Alcohol use: No   Drug use: No    Review of Systems Constitutional: Negative for fever.  Negative for decreased ability to hear from right or left ear(s). Eyes: Negative for discharge or drainage. ENT:       Positive for  otalgia in right ear(s).      Negative for rhinorrhea or congestion.      Negative for sore throat. Gastrointestinal: Negative for nausea, vomiting, or diarrhea. Musculoskeletal: Negative for myalgias. Skin: Negative for rash, lesions, or wounds. Neurological: Negative for paresthesias. ____________________________________________   PHYSICAL EXAM:  VITAL SIGNS: ED Triage Vitals  Enc Vitals Group     BP 07/02/21 0719 (!) 196/103     Pulse Rate 07/02/21 0719 77     Resp 07/02/21 0719 18     Temp --      Temp src --      SpO2 07/02/21 0719 99 %     Weight 07/02/21 0717 250 lb (113.4 kg)     Height 07/02/21 0717 5\' 2"  (1.575 m)     Head Circumference --      Peak Flow --      Pain Score 07/02/21 0717 7     Pain Loc --      Pain Edu? --      Excl. in GC? --     Constitutional: Overall well appearing. Eyes: Conjunctivae are clear without discharge or drainage. Ears:       Right TM: Dull, erythematous, bulging, TM intact.      Left TM: Normal. Head: Atraumatic. Nose: No rhinorrhea or sinus pain on percussion. Mouth/Throat: Oropharynx normal. Tonsils normal without exudate. Hematological/Lymphatic/Immunilogical: No palpable anterior cervical lymphadenopathy. Cardiovascular: Heart rate and rhythm are regular without murmur, gallop,  or rub appreciated. Respiratory: Breath sounds are clear throughout to auscultation.  Neurologic:  Alert and oriented x 4. Skin: Intact and without rash, lesion, or wound on exposed skin surfaces. ____________________________________________   LABS (all labs ordered are listed, but only abnormal results are displayed)  Labs Reviewed - No data to display ____________________________________________   RADIOLOGY  Not indicated ____________________________________________   PROCEDURES  Procedure(s) performed:   Procedures  ____________________________________________   INITIAL IMPRESSION / ASSESSMENT AND PLAN / ED  COURSE  36 year old female presenting to the emergency department for treatment and evaluation of right otalgia.  See HPI for further details.  Exam is consistent with an otitis media following URI.  Plan will be to put her on antibiotics and treat her pain.  She is to follow-up with her primary care provider or return to the emergency department for symptoms that are not improving over the next few days.  Pertinent labs & imaging results that were available during my care of the patient were reviewed by me and considered in my medical decision making (see chart for details). ____________________________________________   FINAL CLINICAL IMPRESSION(S) / ED DIAGNOSES  Final diagnoses:  Non-recurrent acute suppurative otitis media of right ear without spontaneous rupture of tympanic membrane  Hypertension, unspecified type    ED Discharge Orders          Ordered    amoxicillin-clavulanate (AUGMENTIN) 875-125 MG tablet  2 times daily        07/02/21 0740    naproxen (NAPROSYN) 500 MG tablet  2 times daily with meals        07/02/21 0740    HYDROcodone-acetaminophen (NORCO/VICODIN) 5-325 MG tablet  Every 6 hours PRN        07/02/21 0740            If controlled substance prescribed during this visit, 12 month history viewed on the [facility] prior to issuing an initial prescription for Schedule II or III opiod.   Note:  This document was prepared using Dragon voice recognition software and may include unintentional dictation errors.     Chinita Pester, FNP 07/02/21 1438    Concha Se, MD 07/03/21 [phone]

## 2021-12-03 ENCOUNTER — Emergency Department
Admission: EM | Admit: 2021-12-03 | Discharge: 2021-12-03 | Disposition: A | Discharge status: Home / Self Care | Payer: BC Managed Care – PPO | Attending: Emergency Medicine | Admitting: Emergency Medicine

## 2021-12-03 ENCOUNTER — Encounter: Payer: Self-pay | Admitting: Emergency Medicine

## 2021-12-03 ENCOUNTER — Other Ambulatory Visit: Payer: Self-pay

## 2021-12-03 ENCOUNTER — Emergency Department: Payer: BC Managed Care – PPO

## 2021-12-03 DIAGNOSIS — R0789 Other chest pain: Secondary | ICD-10-CM | POA: Insufficient documentation

## 2021-12-03 LAB — BASIC METABOLIC PANEL
Anion gap: 6 (ref 5–15)
BUN: 16 mg/dL (ref 6–20)
CO2: 26 mmol/L (ref 22–32)
Calcium: 8.8 mg/dL — ABNORMAL LOW (ref 8.9–10.3)
Chloride: 102 mmol/L (ref 98–111)
Creatinine, Ser: 0.63 mg/dL (ref 0.44–1.00)
GFR, Estimated: >60 mL/min (ref >60)
Glucose, Bld: 103 mg/dL — ABNORMAL HIGH (ref 70–99)
Potassium: 3.6 mmol/L (ref 3.5–5.1)
Sodium: 134 mmol/L — ABNORMAL LOW (ref 135–145)

## 2021-12-03 LAB — CBC
HCT: 34.7 % — ABNORMAL LOW (ref 36.0–46.0)
Hemoglobin: 10.7 g/dL — ABNORMAL LOW (ref 12.0–15.0)
MCH: 25.8 pg — ABNORMAL LOW (ref 26.0–34.0)
MCHC: 30.8 g/dL (ref 30.0–36.0)
MCV: 83.8 fL (ref 80.0–100.0)
Platelets: 360 10*3/uL (ref 150–400)
RBC: 4.14 MIL/uL (ref 3.87–5.11)
RDW: 17.4 % — ABNORMAL HIGH (ref 11.5–15.5)
WBC: 7.7 10*3/uL (ref 4.0–10.5)
nRBC: 0 % (ref 0.0–0.2)

## 2021-12-03 LAB — TROPONIN I (HIGH SENSITIVITY)
Troponin I (High Sensitivity): 4 ng/L (ref <18)
Troponin I (High Sensitivity): 5 ng/L (ref <18)

## 2021-12-03 LAB — POC URINE PREG, ED: Preg Test, Ur: NEGATIVE

## 2021-12-03 NOTE — ED Provider Notes (Signed)
[facility] Emergency Department Provider Note ____________________________________________   Event Date/Time   First MD Initiated Contact with Patient 12/03/21 1154     (approximate)  I have reviewed the triage vital signs and the nursing notes.  HISTORY  Chief Complaint Chest Pain   HPI Alisha Morales is a 36 y.o. femalewho presents to the ED for evaluation of episode of chest pain.  Chart review indicates morbid obesity.  Patient presents to the ED for an episode of chest pain that occurred earlier today.  She reports waking up at her baseline this morning, then laying down to watch a movie on Netflix, when she developed sharp chest pains.  Denies any associated symptoms, such as shortness of breath, dizziness, nausea, emesis, syncope.   Reports she took some Motrin at home.  Sharp pain was nonradiating, substernal and moderate intensity.  Lasted about 2 hours prior to resolving.  Has not recurred.  She reports feeling fine now.   History reviewed. No pertinent past medical history.  There are no problems to display for this patient.   Past Surgical History:  Procedure Laterality Date   NO PAST SURGERIES      Prior to Admission medications   Medication Sig Start Date End Date Taking? Authorizing Provider  naproxen (NAPROSYN) 500 MG tablet Take 1 tablet (500 mg total) by mouth 2 (two) times daily with a meal. 07/02/21   Triplett, Cari B, FNP    Allergies Patient has no known allergies.  Family History  Problem Relation Age of Onset   Stroke Mother    Diabetes Father    Hypercalcemia Father    Hypertension Father     Social History Social History   Tobacco Use   Smoking status: Never   Smokeless tobacco: Never  Vaping Use   Vaping Use: Never used  Substance Use Topics   Alcohol use: No   Drug use: No    Review of Systems  Constitutional: No fever/chills Eyes: No visual changes. ENT: No sore throat. Cardiovascular:  Positive for chest pain. Respiratory: Denies shortness of breath. Gastrointestinal: No abdominal pain.  No nausea, no vomiting.  No diarrhea.  No constipation. Genitourinary: Negative for dysuria. Musculoskeletal: Negative for back pain. Skin: Negative for rash. Neurological: Negative for headaches, focal weakness or numbness.  ____________________________________________   PHYSICAL EXAM:  VITAL SIGNS: Vitals:   12/03/21 0926 12/03/21 1214  BP: (!) 220/120 (!) 180/100  Pulse: 67 63  Resp: 20 16  Temp: 98 F (36.7 C)   SpO2: 100% 100%     Constitutional: Alert and oriented. Well appearing and in no acute distress. Eyes: Conjunctivae are normal. PERRL. EOMI. Head: Atraumatic. Nose: No congestion/rhinnorhea. Mouth/Throat: Mucous membranes are moist.  Oropharynx non-erythematous. Neck: No stridor. No cervical spine tenderness to palpation. Cardiovascular: Normal rate, regular rhythm. Grossly normal heart sounds.  Good peripheral circulation. Respiratory: Normal respiratory effort.  No retractions. Lungs CTAB. Gastrointestinal: Soft , nondistended, nontender to palpation. No CVA tenderness. Musculoskeletal: No lower extremity tenderness nor edema.  No joint effusions. No signs of acute trauma. Neurologic:  Normal speech and language. No gross focal neurologic deficits are appreciated. No gait instability noted. Skin:  Skin is warm, dry and intact. No rash noted. Psychiatric: Mood and affect are normal. Speech and behavior are normal.  ____________________________________________   LABS (all labs ordered are listed, but only abnormal results are displayed)  Labs Reviewed  BASIC METABOLIC PANEL - Abnormal; Notable for the following components:  Result Value   Sodium 134 (*)    Glucose, Bld 103 (*)    Calcium 8.8 (*)    All other components within normal limits  CBC - Abnormal; Notable for the following components:   Hemoglobin 10.7 (*)    HCT 34.7 (*)    MCH 25.8 (*)     RDW 17.4 (*)    All other components within normal limits  POC URINE PREG, ED  TROPONIN I (HIGH SENSITIVITY)  TROPONIN I (HIGH SENSITIVITY)   ____________________________________________  12 Lead EKG  Sinus rhythm, rate of 70 bpm.  Normal axis and intervals.  No evidence of acute ischemia. ____________________________________________  RADIOLOGY  ED MD interpretation:  CXR reviewed by me without evidence of acute cardiopulmonary pathology.  Official radiology report(s): DG Chest 2 View  Result Date: 12/03/2021 CLINICAL DATA:  Chest pain EXAM: CHEST - 2 VIEW COMPARISON:  None. FINDINGS: The heart size and mediastinal contours are within normal limits. Both lungs are clear. The visualized skeletal structures are unremarkable. IMPRESSION: No active cardiopulmonary disease. Electronically Signed   By: Kennith Center M.D.   On: 12/03/2021 09:48    ____________________________________________   PROCEDURES and INTERVENTIONS  Procedure(s) performed (including Critical Care):  Procedures  Medications - No data to display  ____________________________________________   MDM / ED COURSE   Obese 37 year old female presents to the ED after a resolved episode of atypical chest pain, without evidence of acute pathology, and amenable to outpatient management.  Hypertensive, but otherwise hemodynamically stable.  No active chest pain here in the ED.  No evidence of ACS, unstable angina, dysrhythmia, PTX or severe anemia.  No barriers to outpatient management.  We will discharge with return precautions.     ____________________________________________   FINAL CLINICAL IMPRESSION(S) / ED DIAGNOSES  Final diagnoses:  Other chest pain     ED Discharge Orders     None        Annel Zunker   Note:  This document was prepared using Dragon voice recognition software and may include unintentional dictation errors.    Delton Prairie, MD 12/03/21 1311

## 2021-12-03 NOTE — ED Triage Notes (Signed)
Pt reports cp to her mid chest that is constant and sharp in nature for the past hour. Pt denies SOB, nausea or other sx's, reports just the constant sharp pain.

## 2021-12-03 NOTE — ED Notes (Signed)
Urine collected and extra sent to lab with temp label.

## 2021-12-03 NOTE — ED Notes (Signed)
Dc ppw provided to patient. Followup information provided. RX information given. Questions answered. Pt provides verbal consent for discharge. VS declined at dc. Pt assisted off unit. 

## 2021-12-03 NOTE — Discharge Instructions (Signed)
Please take Tylenol and ibuprofen/Advil for your pain.  It is safe to take them together, or to alternate them every few hours.  Take up to 1000mg of Tylenol at a time, up to 4 times per day.  Do not take more than 4000 mg of Tylenol in 24 hours.  For ibuprofen, take 400-600 mg, 4-5 times per day. ° ° °

## 2022-08-14 ENCOUNTER — Encounter: Payer: Self-pay | Admitting: Emergency Medicine

## 2022-08-14 ENCOUNTER — Emergency Department: Payer: BC Managed Care – PPO

## 2022-08-14 ENCOUNTER — Other Ambulatory Visit: Payer: Self-pay

## 2022-08-14 ENCOUNTER — Emergency Department
Admission: EM | Admit: 2022-08-14 | Discharge: 2022-08-14 | Disposition: A | Discharge status: Home / Self Care | Payer: BC Managed Care – PPO | Attending: Emergency Medicine | Admitting: Emergency Medicine

## 2022-08-14 DIAGNOSIS — Z79899 Other long term (current) drug therapy: Secondary | ICD-10-CM | POA: Insufficient documentation

## 2022-08-14 DIAGNOSIS — D649 Anemia, unspecified: Secondary | ICD-10-CM | POA: Diagnosis not present

## 2022-08-14 DIAGNOSIS — I10 Essential (primary) hypertension: Secondary | ICD-10-CM | POA: Diagnosis not present

## 2022-08-14 DIAGNOSIS — R519 Headache, unspecified: Secondary | ICD-10-CM | POA: Diagnosis present

## 2022-08-14 LAB — CBC
HCT: 36.4 % (ref 36.0–46.0)
Hemoglobin: 11.3 g/dL — ABNORMAL LOW (ref 12.0–15.0)
MCH: 27.2 pg (ref 26.0–34.0)
MCHC: 31 g/dL (ref 30.0–36.0)
MCV: 87.7 fL (ref 80.0–100.0)
Platelets: 339 10*3/uL (ref 150–400)
RBC: 4.15 MIL/uL (ref 3.87–5.11)
RDW: 14.8 % (ref 11.5–15.5)
WBC: 6.3 10*3/uL (ref 4.0–10.5)
nRBC: 0 % (ref 0.0–0.2)

## 2022-08-14 LAB — POC URINE PREG, ED
Preg Test, Ur: NEGATIVE
Preg Test, Ur: NEGATIVE

## 2022-08-14 LAB — BASIC METABOLIC PANEL
Anion gap: 6 (ref 5–15)
BUN: 9 mg/dL (ref 6–20)
CO2: 26 mmol/L (ref 22–32)
Calcium: 9.2 mg/dL (ref 8.9–10.3)
Chloride: 107 mmol/L (ref 98–111)
Creatinine, Ser: 0.64 mg/dL (ref 0.44–1.00)
GFR, Estimated: >60 mL/min (ref >60)
Glucose, Bld: 88 mg/dL (ref 70–99)
Potassium: 4 mmol/L (ref 3.5–5.1)
Sodium: 139 mmol/L (ref 135–145)

## 2022-08-14 MED ORDER — AMLODIPINE BESYLATE 5 MG PO TABS
10.0000 mg | ORAL_TABLET | Freq: Once | ORAL | Status: AC
  Administered 2022-08-14: 10 mg via ORAL
  Filled 2022-08-14: qty 2

## 2022-08-14 NOTE — ED Triage Notes (Signed)
Pt reports sent from PCP with reports of hypertension and headache.

## 2022-08-14 NOTE — ED Provider Notes (Signed)
[facility] Provider Note    Event Date/Time   First MD Initiated Contact with Patient 08/14/22 1259     (approximate)   History   Hypertension   HPI  Alisha Morales is a 37 y.o. female who comes in from PCP due to concern for hypertension and headache.  Patient reports that she originally went into [facility] clinic due to having a hot flash and concerned that she could be pregnant.  They recommended she come to the emergency room due to her elevated blood pressure but she did not want to come in.  She then followed up with her primary care doctor today who noticed that given her hypertension that she would should be sent to the emergency room for repeat evaluation here.  She denies any passing out or loss of consciousness chest pain shortness of breath.  Her doctor did write her a prescription for 10 mg of amlodipine.  She denies any abdominal pain or other symptoms.  She reported very mild headache currently a 1 out of 10. Denies any blurred vision     Physical Exam   Triage Vital Signs: ED Triage Vitals  Enc Vitals Group     BP 08/14/22 1256 (!) 229/104     Pulse Rate 08/14/22 1154 73     Resp 08/14/22 1154 18     Temp 08/14/22 1154 99.3 F (37.4 C)     Temp Source 08/14/22 1154 Oral     SpO2 08/14/22 1154 100 %     Weight --      Height --      Head Circumference --      Peak Flow --      Pain Score 08/14/22 1154 3     Pain Loc --      Pain Edu? --      Excl. in GC? --     Most recent vital signs: Vitals:   08/14/22 1256 08/14/22 1345  BP: (!) 229/104 (!) 189/109  Pulse:  70  Resp:  18  Temp:    SpO2: 100% 100%     General: Awake, no distress.  CV:  Good peripheral perfusion.  Resp:  Normal effort.  Abd:  No distention.  Soft and nontender Other:  Nerves II through XII are intact.  Equal strength in arms and legs. Pupils equal and reactive.    ED Results / Procedures / Treatments   Labs (all labs ordered are listed, but only  abnormal results are displayed) Labs Reviewed  CBC - Abnormal; Notable for the following components:      Result Value   Hemoglobin 11.3 (*)    All other components within normal limits  BASIC METABOLIC PANEL     EKG  My interpretation of EKG:  Normal sinus rate of 64 without any ST elevation or T wave inversions and normal intervals  RADIOLOGY I have reviewed the CT head personally interpreted no evidence of intracranial hemorrhage   PROCEDURES:  Critical Care performed: No  Procedures   MEDICATIONS ORDERED IN ED: Medications - No data to display   IMPRESSION / MDM / ASSESSMENT AND PLAN / ED COURSE  I reviewed the triage vital signs and the nursing notes.   Patient's presentation is most consistent with acute presentation with potential threat to life or bodily function.   Differentials hypertensive urgency, AKI, arrhythmia, intracranial hemorrhage  BMP reassuring.  CBC shows slightly low hemoglobin but stable to prior  CT head is negative  Pregnancy test is negative  I reviewed her blood pressure from yesterday was 190/101.  Her blood pressure back in December 2022 was also 220/120  Given her prior blood pressures back in December were similar I suspect that this is been going on on a long time and given her reassuring work-up she just needs correction of her blood pressures over time.  We discussed starting the amlodipine and given her first dose today and recording her blood pressure was in the morning once at nighttime and following up with her primary care doctor in 1 week to see if they need to add a second agent.  We discussed the dangers of trying to lower her blood pressure acutely here in the emergency room unless there is a new complication.  We discussed return precautions in regards to elevated blood pressures plus concurrent symptoms.  Considered admission but given no evidence of hypertensive emergency or endorgan damage patient can follow-up  outpatient.  I rechecked her temperature and it was 98.6 and she denies any infectious symptoms     FINAL CLINICAL IMPRESSION(S) / ED DIAGNOSES   Final diagnoses:  Hypertension, unspecified type     Rx / DC Orders   ED Discharge Orders     None        Note:  This document was prepared using Dragon voice recognition software and may include unintentional dictation errors.   Concha Se, MD 08/14/22 [phone]

## 2022-08-14 NOTE — Discharge Instructions (Addendum)
Start your amlodipine tomorrow and return to the ER if you develop high blood pressure plus associated symptoms otherwise you can follow-up with your primary care doctor for recheck of blood pressure in 1 week to see if it needs to be further adjusted.  Return to the ER for chest pain, worsening headache or any other concerns

## 2022-08-14 NOTE — ED Provider Triage Note (Signed)
Emergency Medicine Provider Triage Evaluation Note  Alisha Morales , a 37 y.o. female  was evaluated in triage.  Pt complains of headache, hypertension.  Saw her regular doctor yesterday and her blood pressure was over 200 systolic.  States the diastolic was also elevated.  Review of Systems  Positive: Headache, hypertension Negative: Vomiting  Physical Exam  Pulse 73   Temp 99.3 F (37.4 C) (Oral)   Resp 18   SpO2 100%  Gen:   Awake, no distress   Resp:  Normal effort  MSK:   Moves extremities without difficulty  Other:    Medical Decision Making  Medically screening exam initiated at 12:02 PM.  Appropriate orders placed.  Dorien Chihuahua was informed that the remainder of the evaluation will be completed by another provider, this initial triage assessment does not replace that evaluation, and the importance of remaining in the ED until their evaluation is complete.     Faythe Ghee, PA-C 08/14/22 1204

## 2022-08-31 DIAGNOSIS — B977 Papillomavirus as the cause of diseases classified elsewhere: Secondary | ICD-10-CM

## 2022-08-31 HISTORY — DX: Papillomavirus as the cause of diseases classified elsewhere: B97.7

## 2022-11-30 ENCOUNTER — Ambulatory Visit: Payer: Self-pay

## 2022-12-11 IMAGING — CR DG CHEST 2V
1 series · 2 of 2 positions shown · non-contrast
Comparison: None.

CLINICAL DATA: Chest pain

EXAM:
CHEST - 2 VIEW

[Series 1: dg chest 2 view · 0.14mm/px · 2 of 2 slices shown]
[im 1/2]
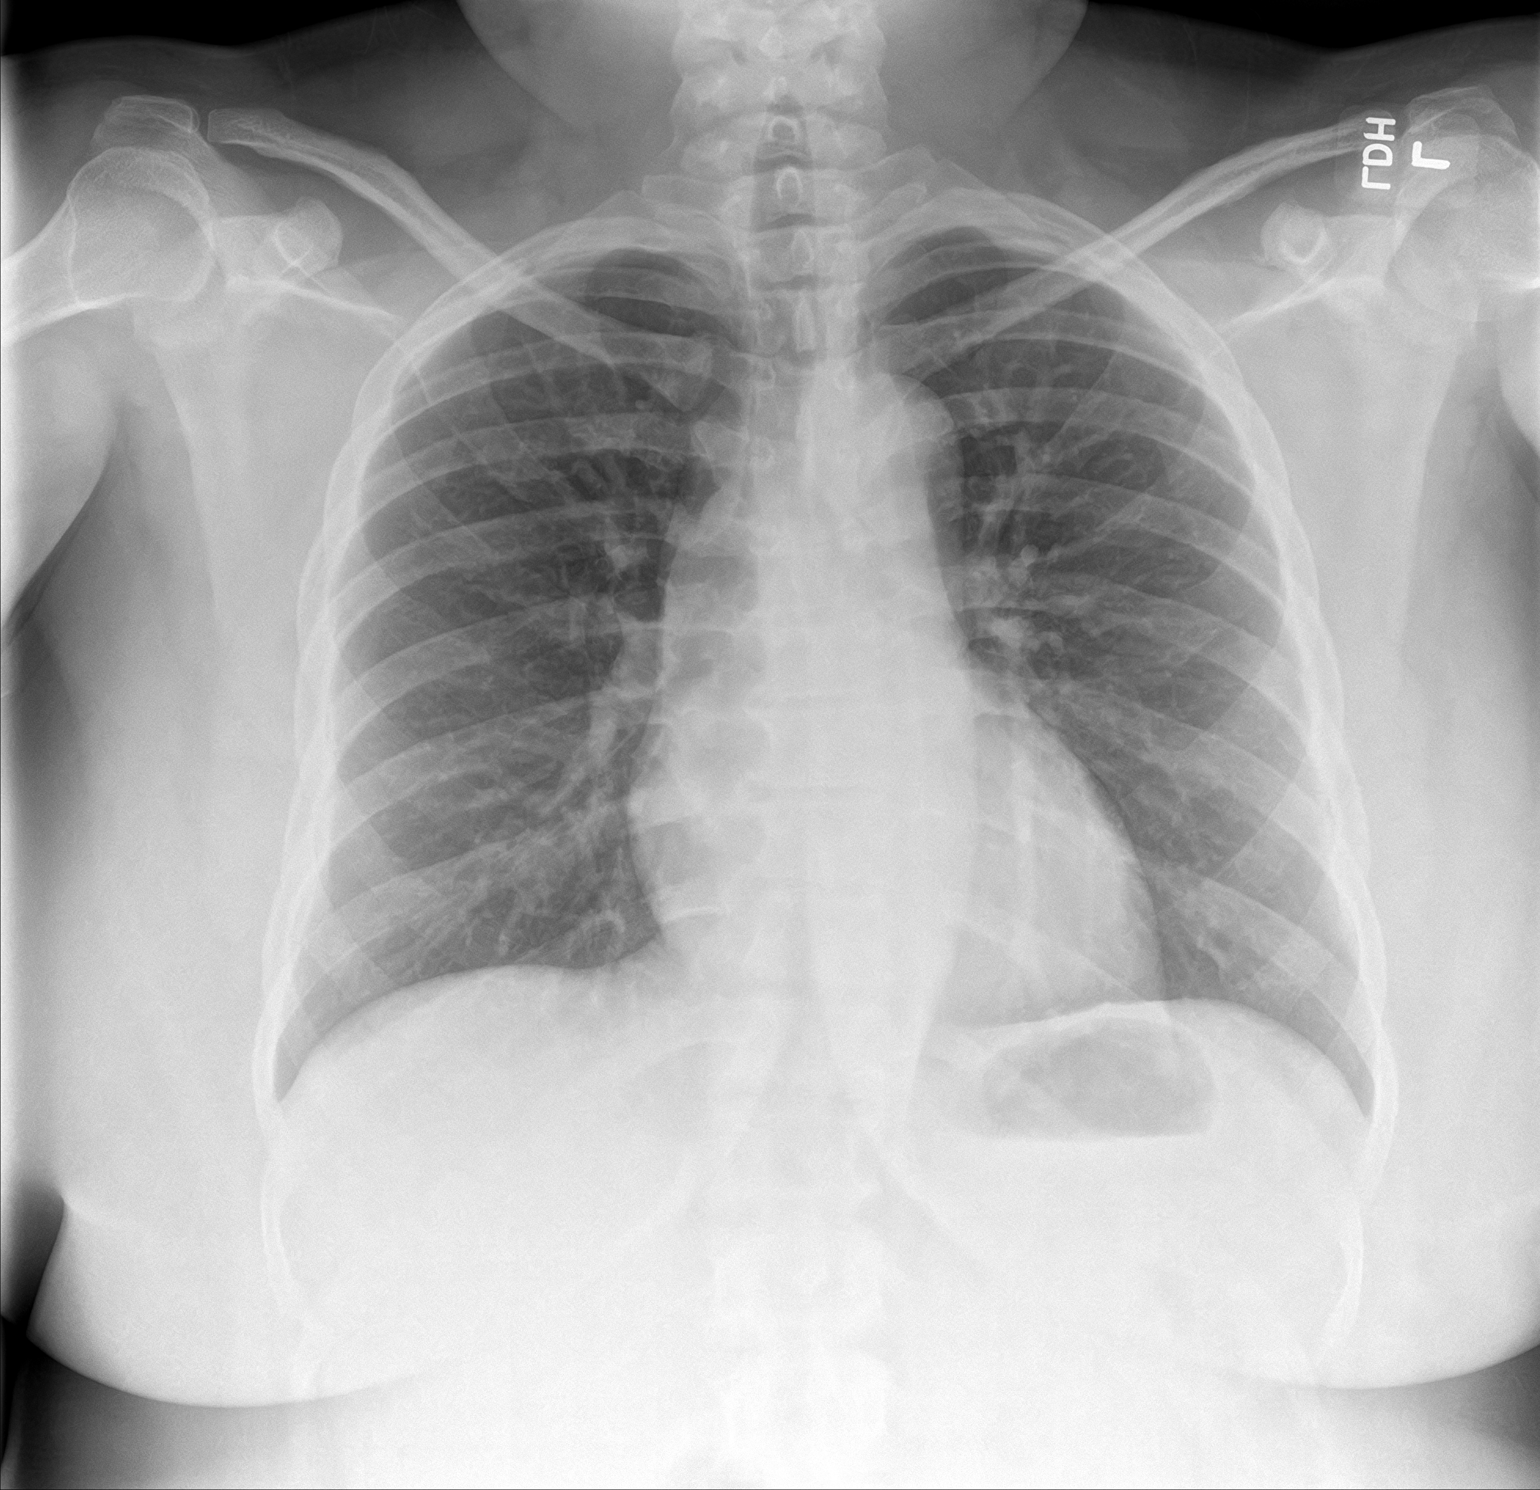
[im 2/2]
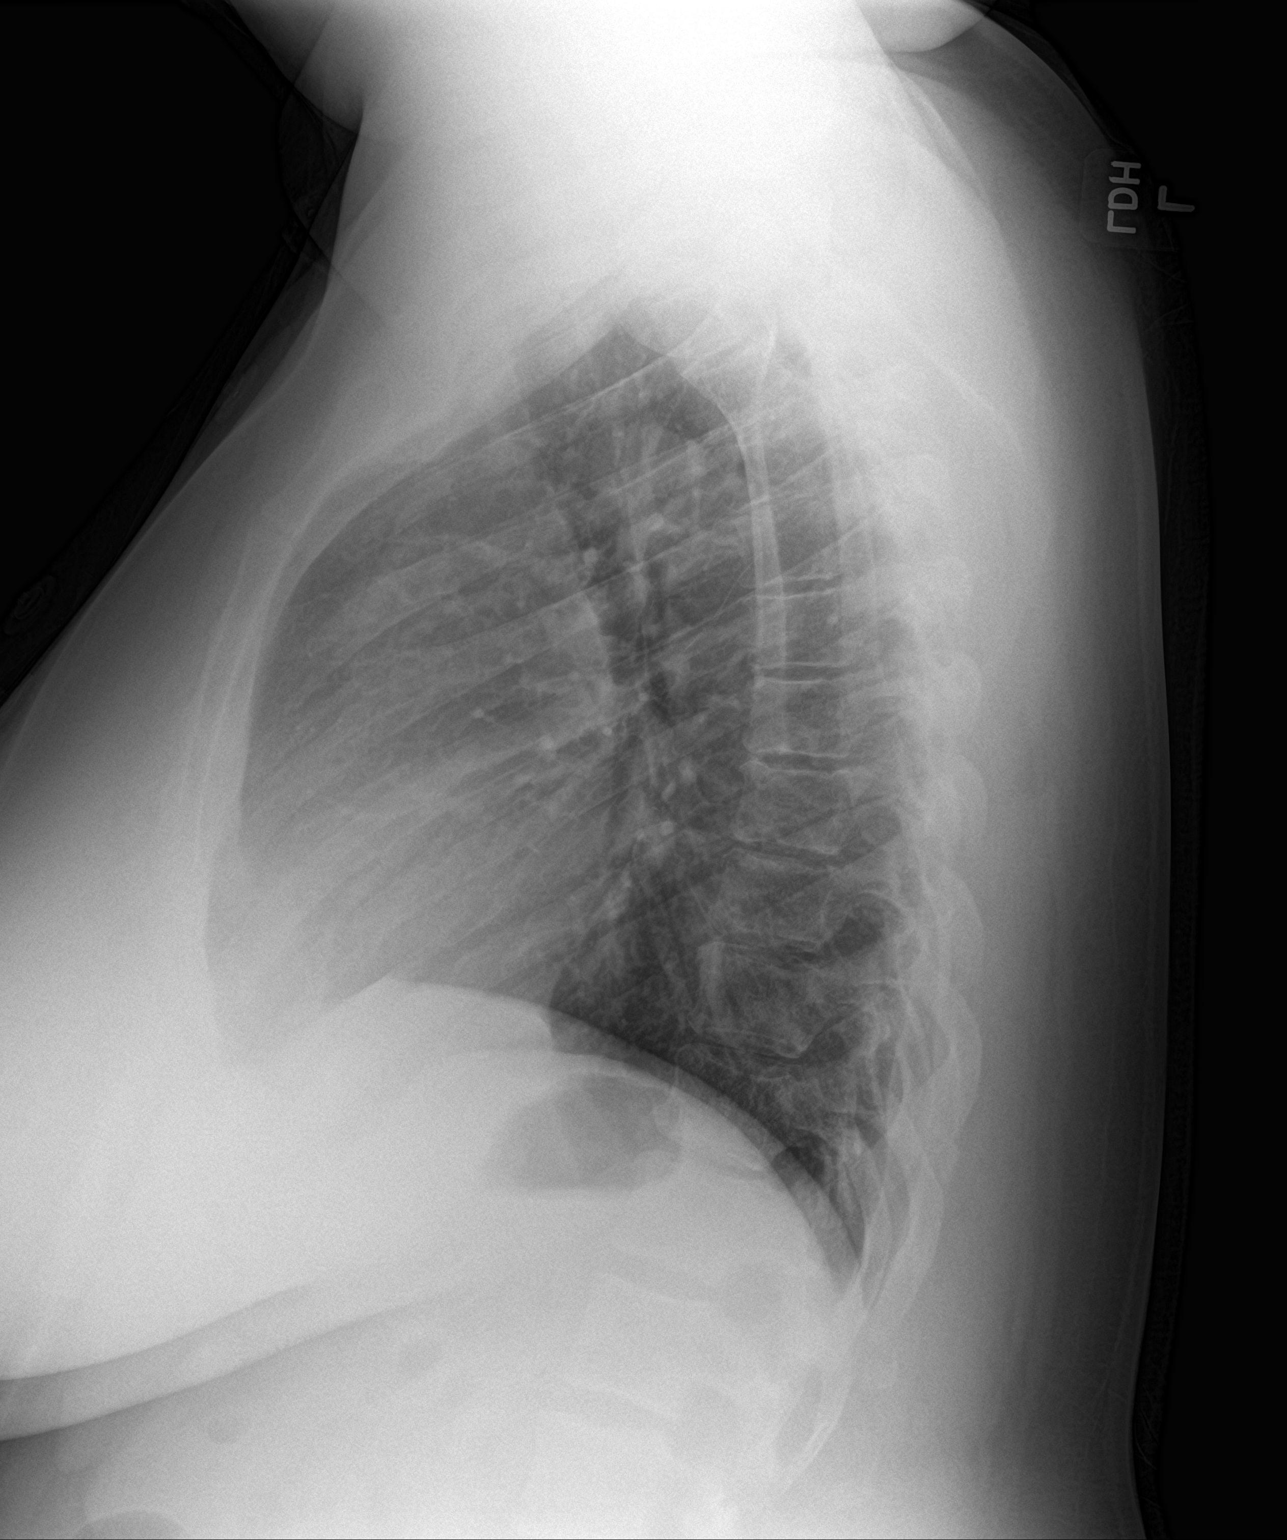

[2 of 2 positions shown; findings below may reference images not displayed]

FINDINGS: The heart size and mediastinal contours are within normal limits.
Both lungs are clear. The visualized skeletal structures are
unremarkable.
IMPRESSION: No active cardiopulmonary disease.

## 2022-12-31 NOTE — L&D Delivery Note (Signed)
Delivery Note   Alisha Morales is a 38 y.o. G3P1101 at [redacted]w[redacted]d Estimated Date of Delivery: 11/10/23  PRE-OPERATIVE DIAGNOSIS:  1) [redacted]w[redacted]d pregnancy.  2) cHTN well-controlled with medication 3) BMI 49 3) GBS positive 4) Uterine fibriod  POST-OPERATIVE DIAGNOSIS:  1) [redacted]w[redacted]d pregnancy s/p Vaginal, Spontaneous Above and: 2) GBS positive with adequate PCN prophylaxis  Delivery Type: Vaginal, Spontaneous   Delivery Anesthesia: Epidural  Labor Complications:  none    ESTIMATED BLOOD LOSS: 50 ml    FINDINGS:   1) female infant, Apgar scores of 8   at 1 minute and 9   at 5 minutes and a birthweight of   ounces.     SPECIMENS:   PLACENTA:   Appearance: Intact   Removal: Spontaneous     Disposition:  discarded  CORD BLOOD: sent to lab for blood type  DISPOSITION:  Infant left in stable condition in the delivery room, with L&D personnel and mother,  NARRATIVE SUMMARY: Labor course:  Alisha Morales is a G4W1027 at [redacted]w[redacted]d who presented to Labor & Delivery for induction of labor.  Labor proceeded with augmentation and she was found to be completely dilated at 2300. With excellent maternal pushing effort, she birthed a viable female infant at 2320. There was a nuchal cord and the infant was delivered via somersault maneuver. The shoulders were birthed without difficulty. The infant was placed skin-to-skin with mother.  The placenta delivered spontaneously and was noted to be intact with a 3VC. The cord was doubly clamped and cut after the placenta was delivered. A perineal and vaginal examination was performed. Episiotomy/Lacerations: None Mother and baby were left in stable condition.  Dr. Lonny Prude was notified of delivery.   Lindalou Hose Prophet Renwick, CNM 10/22/2023 11:41 PM

## 2023-04-01 ENCOUNTER — Ambulatory Visit (INDEPENDENT_AMBULATORY_CARE_PROVIDER_SITE_OTHER): Payer: BC Managed Care – PPO

## 2023-04-01 VITALS — Wt 250.0 lb

## 2023-04-01 DIAGNOSIS — Z3480 Encounter for supervision of other normal pregnancy, unspecified trimester: Secondary | ICD-10-CM | POA: Insufficient documentation

## 2023-04-01 DIAGNOSIS — Z130 Encounter for screening for diseases of the blood and blood-forming organs and certain disorders involving the immune mechanism: Secondary | ICD-10-CM

## 2023-04-01 DIAGNOSIS — Z131 Encounter for screening for diabetes mellitus: Secondary | ICD-10-CM

## 2023-04-01 DIAGNOSIS — Z3689 Encounter for other specified antenatal screening: Secondary | ICD-10-CM

## 2023-04-01 DIAGNOSIS — Z369 Encounter for antenatal screening, unspecified: Secondary | ICD-10-CM

## 2023-04-01 DIAGNOSIS — O0990 Supervision of high risk pregnancy, unspecified, unspecified trimester: Secondary | ICD-10-CM | POA: Insufficient documentation

## 2023-04-01 NOTE — Progress Notes (Signed)
New OB Intake  I connected with  Alisha Morales on 04/01/23 at  1:15 PM EDT by telephone and verified that I am speaking with the correct person using two identifiers. Nurse is located at [employer] and pt is located at home.  I explained I am completing New OB Intake today. We discussed her EDD of 11/10/2023 that is based on LMP of 02/03/2023. Pt is G3/P1101. I reviewed her allergies, medications, Medical/Surgical/OB history, and appropriate screenings. There are cats in the home no.  Based on history, this is a/an pregnancy uncomplicated .   Patient Active Problem List   Diagnosis Date Noted   Supervision of other normal pregnancy, antepartum 04/01/2023    Concerns addressed today None  Delivery Plans:  Plans to deliver at [facility].  Anatomy US Explained first scheduled [place] will be 4/22nd and an anatomy scan will be done at 20 weeks.  Labs Discussed genetic screening with patient. Patient desures genetic testing to be drawn with new OB labs. Discussed possible labs to be drawn at new OB appointment.  COVID Vaccine Patient has had COVID vaccine.   Social Determinants of Health Food Insecurity: denies food insecurity Transportation: Patient denies transportation needs. Childcare: Discussed no children allowed at ultrasound appointments.   First visit review I reviewed new OB appt with pt. I explained she will have ob bloodwork and pap smear/pelvic exam if indicated. Explained pt will be seen by Philip Aspen, CNM at first visit; encounter routed to appropriate provider.   Cleophas Dunker, [place] 04/01/2023  1:39 PM

## 2023-04-01 NOTE — Patient Instructions (Signed)
First Trimester of Pregnancy  The first trimester of pregnancy starts on the first day of your last menstrual period until the end of week 12. This is also called months 1 through 3 of pregnancy. Body changes during your first trimester Your body goes through many changes during pregnancy. The changes usually return to normal after your baby is born. Physical changes You may gain or lose weight. Your breasts may grow larger and hurt. The area around your nipples may get darker. Dark spots or blotches may develop on your face. You may have changes in your hair. Health changes You may feel like you might vomit (nauseous), and you may vomit. You may have heartburn. You may have headaches. You may have trouble pooping (constipation). Your gums may bleed. Other changes You may get tired easily. You may pee (urinate) more often. Your menstrual periods will stop. You may not feel hungry. You may want to eat certain kinds of food. You may have changes in your emotions from day to day. You may have more dreams. Follow these instructions at home: Medicines Take over-the-counter and prescription medicines only as told by your doctor. Some medicines are not safe during pregnancy. Take a prenatal vitamin that contains at least 600 micrograms (mcg) of folic acid. Eating and drinking Eat healthy meals that include: Fresh fruits and vegetables. Whole grains. Good sources of protein, such as meat, eggs, or tofu. Low-fat dairy products. Avoid raw meat and unpasteurized juice, milk, and cheese. If you feel like you may vomit, or you vomit: Eat 4 or 5 small meals a day instead of 3 large meals. Try eating a few soda crackers. Drink liquids between meals instead of during meals. You may need to take these actions to prevent or treat trouble pooping: Drink enough fluids to keep your pee (urine) pale yellow. Eat foods that are high in fiber. These include beans, whole grains, and fresh fruits and  vegetables. Limit foods that are high in fat and sugar. These include fried or sweet foods. Activity Exercise only as told by your doctor. Most people can do their usual exercise routine during pregnancy. Stop exercising if you have cramps or pain in your lower belly (abdomen) or low back. Do not exercise if it is too hot or too humid, or if you are in a place of great height (high altitude). Avoid heavy lifting. If you choose to, you may have sex unless your doctor tells you not to. Relieving pain and discomfort Wear a good support bra if your breasts are sore. Rest with your legs raised (elevated) if you have leg cramps or low back pain. If you have bulging veins (varicose veins) in your legs: Wear support hose as told by your doctor. Raise your feet for 15 minutes, 3-4 times a day. Limit salt in your food. Safety Wear your seat belt at all times when you are in a car. Talk with your doctor if someone is hurting you or yelling at you. Talk with your doctor if you are feeling sad or have thoughts of hurting yourself. Lifestyle Do not use hot tubs, steam rooms, or saunas. Do not douche. Do not use tampons or scented sanitary pads. Do not use herbal medicines, illegal drugs, or medicines that are not approved by your doctor. Do not drink alcohol. Do not smoke or use any products that contain nicotine or tobacco. If you need help quitting, ask your doctor. Avoid cat litter boxes and soil that is used by cats. These carry   germs that can cause harm to the baby and can cause a loss of your baby by miscarriage or stillbirth. General instructions Keep all follow-up visits. This is important. Ask for help if you need counseling or if you need help with nutrition. Your doctor can give you advice or tell you where to go for help. Visit your dentist. At home, brush your teeth with a soft toothbrush. Floss gently. Write down your questions. Take them to your prenatal visits. Where to find more  information American Pregnancy Association: americanpregnancy.org [employer]: www.acog.org Office on Women's Health: KeywordPortfolios.com.br Contact a doctor if: You are dizzy. You have a fever. You have mild cramps or pressure in your lower belly. You have a nagging pain in your belly area. You continue to feel like you may vomit, you vomit, or you have watery poop (diarrhea) for 24 hours or longer. You have a bad-smelling fluid coming from your vagina. You have pain when you pee. You are exposed to a disease that spreads from person to person, such as chickenpox, measles, Zika virus, HIV, or hepatitis. Get help right away if: You have spotting or bleeding from your vagina. You have very bad belly cramping or pain. You have shortness of breath or chest pain. You have any kind of injury, such as from a fall or a car crash. You have new or increased pain, swelling, or redness in an arm or leg. Summary The first trimester of pregnancy starts on the first day of your last menstrual period until the end of week 12 (months 1 through 3). Eat 4 or 5 small meals a day instead of 3 large meals. Do not smoke or use any products that contain nicotine or tobacco. If you need help quitting, ask your doctor. Keep all follow-up visits. This information is not intended to replace advice given to you by your health care provider. Make sure you discuss any questions you have with your health care provider. Document Revised: 05/25/2020 Document Reviewed: 03/31/2020 Elsevier Patient Education  [place]. Human Papillomavirus Human papillomavirus (HPV) is the most common sexually transmitted infection (STI). It easily spreads from person to person (is very contagious). There are many types of HPV. HPV often does not cause symptoms. Sometimes it may cause warts in the genitals or anus. These warts can be seen and felt. There may also be wart-like lesions in  the throat. You can have HPV for a long time and not know it. You may spread HPV to others without knowing it. Certain types of HPV may cause cancer. What are the causes? HPV is caused by a virus that spreads through skin-to-skin contact or contact through sex. What increases the risk? You may be more likely to get HPV if you have or have had: Contact with a person who has HPV. Unprotected sex of any kind. Several sex partners. A sex partner who has other sex partners. Another sexually transmitted infection (STI). A weak disease-fighting system (immune system). Damage to the skin. What are the signs or symptoms? Most people who have HPV do not have any symptoms. If you have symptoms, they may include: Wart-like lesions in the throat from having oral sex. Warts on the infected areas. Warts in the genitals. The warts may itch, burn, bleed, or be painful during sex. How is this treated? There is no treatment for the virus itself. However, there are treatments for the health problems and symptoms HPV can cause. Your doctor may treat HPV  by: Giving medicines that are creams, lotions, liquids, or gels. These medicines may be injected into or put onto warts in the genitals or anus. Applying one of the following to the warts in the genitals or anus: Something that is very cold. A strong beam of light (laser treatment). Something very hot. Doing surgery to remove the warts in the genitals or anus. Your doctor will monitor you closely after you are treated. HPV can come back and you may need treatment again. Follow these instructions at home: Medicines Take over-the-counter and prescription medicines only as told by your doctor. Do not treat warts in the genitals with medicines that are meant for warts in the hands. General instructions Do not touch or scratch the warts. Do not have sex while you are being treated. Do not douche or use tampons during treatment (for women). Tell your sex  partner about your infection. He or she may also need to be treated. If you get pregnant, tell your doctor that you have HPV. Your doctor will monitor you during the pregnancy. Keep all follow-up visits. This is important. How is this prevented? Talk with your doctor about getting an HPV vaccine. This can prevent some HPV infections and related cancers. You may need 2-3 doses of the vaccine, depending on your age. The vaccine will not work if you already have HPV. The vaccine is not recommended for pregnant women. After treatment, use condoms during sex. This helps to prevent future infections. Have only one sex partner. Have a sex partner who does not have other sex partners. Get Pap tests as told by your doctor. Contact a doctor if: The treated skin gets red, swollen, or painful. You have a fever. You feel ill. You feel lumps or pimples in and around your genitals or anus. You have bleeding from the vagina. You have bleeding from the area that was treated. You have pain during sex. Summary HPV is the most common STI. It easily spreads from person to person. HPV often does not cause any symptoms. HPV vaccine can prevent some HPV infections and related cancers. There is no treatment for the virus itself. However, there are treatments for the health problems and symptoms HPV can cause. This information is not intended to replace advice given to you by your health care provider. Make sure you discuss any questions you have with your health care provider. Document Revised: 08/01/2020 Document Reviewed: 08/02/2020 Elsevier Patient Education  [place]. Commonly Asked Questions During Pregnancy  Cats: A parasite can be excreted in cat feces.  To avoid exposure you need to have another person empty the little box.  If you must empty the litter box you will need to wear gloves.  Wash your hands after handling your cat.  This parasite can also be found in raw or undercooked meat so this  should also be avoided.  Colds, Sore Throats, Flu: Please check your medication sheet to see what you can take for symptoms.  If your symptoms are unrelieved by these medications please call the office.  Dental Work: Most any dental work Investment banker, corporate recommends is permitted.  X-rays should only be taken during the first trimester if absolutely necessary.  Your abdomen should be shielded with a lead apron during all x-rays.  Please notify your provider prior to receiving any x-rays.  Novocaine is fine; gas is not recommended.  If your dentist requires a note from [place] prior to dental work please call the office and we will provide  one for you.  Exercise: Exercise is an important part of staying healthy during your pregnancy.  You may continue most exercises you were accustomed to prior to pregnancy.  Later in your pregnancy you will most likely notice you have difficulty with activities requiring balance like riding a bicycle.  It is important that you listen to your body and avoid activities that put you at a higher risk of falling.  Adequate rest and staying well hydrated are a must!  If you have questions about the safety of specific activities ask your provider.    Exposure to Children with illness: Try to avoid obvious exposure; report any symptoms to [place] when noted,  If you have chicken pos, red measles or mumps, you should be immune to these diseases.   Please do not take any vaccines while pregnant unless you have checked with your OB provider.  Fetal Movement: After 28 weeks we recommend you do "kick counts" twice daily.  Lie or sit down in a calm quiet environment and count your baby movements "kicks".  You should feel your baby at least 10 times per hour.  If you have not felt 10 kicks within the first hour get up, walk around and have something sweet to eat or drink then repeat for an additional hour.  If count remains less than 10 per hour notify your provider.  Fumigating: Follow your pest  control agent's advice as to how long to stay out of your home.  Ventilate the area well before re-entering.  Hemorrhoids:   Most over-the-counter preparations can be used during pregnancy.  Check your medication to see what is safe to use.  It is important to use a stool softener or fiber in your diet and to drink lots of liquids.  If hemorrhoids seem to be getting worse please call the office.   Hot Tubs:  Hot tubs Jacuzzis and saunas are not recommended while pregnant.  These increase your internal body temperature and should be avoided.  Intercourse:  Sexual intercourse is safe during pregnancy as long as you are comfortable, unless otherwise advised by your provider.  Spotting may occur after intercourse; report any bright red bleeding that is heavier than spotting.  Labor:  If you know that you are in labor, please go to the hospital.  If you are unsure, please call the office and let us help you decide what to do.  Lifting, straining, etc:  If your job requires heavy lifting or straining please check with your provider for any limitations.  Generally, you should not lift items heavier than that you can lift simply with your hands and arms (no back muscles)  Painting:  Paint fumes do not harm your pregnancy, but may make you ill and should be avoided if possible.  Latex or water based paints have less odor than oils.  Use adequate ventilation while painting.  Permanents & Hair Color:  Chemicals in hair dyes are not recommended as they cause increase hair dryness which can increase hair loss during pregnancy.  " Highlighting" and permanents are allowed.  Dye may be absorbed differently and permanents may not hold as well during pregnancy.  Sunbathing:  Use a sunscreen, as skin burns easily during pregnancy.  Drink plenty of fluids; avoid over heating.  Tanning Beds:  Because their possible side effects are still unknown, tanning beds are not recommended.  Ultrasound Scans:  Routine  ultrasounds are performed at approximately 20 weeks.  You will be able to see your  baby's general anatomy an if you would like to know the gender this can usually be determined as well.  If it is questionable when you conceived you may also receive an ultrasound early in your pregnancy for dating purposes.  Otherwise ultrasound exams are not routinely performed unless there is a medical necessity.  Although you can request a scan we ask that you pay for it when conducted because insurance does not cover " patient request" scans.  Work: If your pregnancy proceeds without complications you may work until your due date, unless your physician or employer advises otherwise.  Round Ligament Pain/Pelvic Discomfort:  Sharp, shooting pains not associated with bleeding are fairly common, usually occurring in the second trimester of pregnancy.  They tend to be worse when standing up or when you remain standing for long periods of time.  These are the result of pressure of certain pelvic ligaments called "round ligaments".  Rest, Tylenol and heat seem to be the most effective relief.  As the womb and fetus grow, they rise out of the pelvis and the discomfort improves.  Please notify the office if your pain seems different than that described.  It may represent a more serious condition.  Common Medications Safe in Pregnancy  Acne:      Constipation:  Benzoyl Peroxide     Colace  Clindamycin      Dulcolax Suppository  Topica Erythromycin     Fibercon  Salicylic Acid      Metamucil         Miralax AVOID:        Senakot   Accutane    Cough:  Retin-A       Cough Drops  Tetracycline      Phenergan w/ Codeine if Rx  Minocycline      Robitussin (Plain & DM)  Antibiotics:     Crabs/Lice:  Ceclor       RID  Cephalosporins    AVOID:  E-Mycins      Kwell  Keflex  Macrobid/Macrodantin   Diarrhea:  Penicillin      Kao-Pectate  Zithromax      Imodium AD         PUSH  FLUIDS AVOID:       Cipro     Fever:  Tetracycline      Tylenol (Regular or Extra  Minocycline       Strength)  Levaquin      Extra Strength-Do not          Exceed 8 tabs/24 hrs Caffeine:        200mg /day (equiv. To 1 cup of coffee or  approx. 3 12 oz sodas)         Gas: Cold/Hayfever:       Gas-X  Benadryl      Mylicon  Claritin       Phazyme  **Claritin-D        Chlor-Trimeton    Headaches:  Dimetapp      ASA-Free Excedrin  Drixoral-Non-Drowsy     Cold Compress  Mucinex (Guaifenasin)     Tylenol (Regular or Extra  Sudafed/Sudafed-12 Hour     Strength)  **Sudafed PE Pseudoephedrine   Tylenol Cold & Sinus     Vicks Vapor Rub  Zyrtec  **AVOID if Problems With Blood Pressure         Heartburn: Avoid lying down for at least 1 hour after meals  Aciphex      Maalox     Rash:  Milk of Magnesia     Benadryl    Mylanta       1% Hydrocortisone Cream  Pepcid  Pepcid Complete   Sleep Aids:  Prevacid      Ambien   Prilosec       Benadryl  Rolaids       Chamomile Tea  Tums (Limit 4/day)     Unisom         Tylenol PM         Warm milk-add vanilla or  Hemorrhoids:       Sugar for taste  Anusol/Anusol H.C.  (RX: Analapram 2.5%)  Sugar Substitutes:  Hydrocortisone OTC     Ok in moderation  Preparation H      Tucks        Vaseline lotion applied to tissue with wiping    Herpes:     Throat:  Acyclovir      Oragel  Famvir  Valtrex     Vaccines:         Flu Shot Leg Cramps:       *Gardasil  Benadryl      Hepatitis A         Hepatitis B Nasal Spray:       Pneumovax  Saline Nasal Spray     Polio Booster         Tetanus Nausea:       Tuberculosis test or PPD  Vitamin B6 25 mg TID   AVOID:    Dramamine      *Gardasil  Emetrol       Live Poliovirus  Ginger Root 250 mg QID    MMR (measles, mumps &  High Complex Carbs @ Bedtime    rebella)  Sea Bands-Accupressure    Varicella (Chickenpox)  Unisom 1/2 tab TID     *No known complications           If received  before Pain:         Known pregnancy;   Darvocet       Resume series after  Lortab        Delivery  Percocet    Yeast:   Tramadol      Femstat  Tylenol 3      Gyne-lotrimin  Ultram       Monistat  Vicodin           MISC:         All Sunscreens           Hair Coloring/highlights          Insect Repellant's          (Including DEET)         Mystic Tans

## 2023-04-22 ENCOUNTER — Other Ambulatory Visit: Payer: Self-pay | Admitting: Certified Nurse Midwife

## 2023-04-22 ENCOUNTER — Other Ambulatory Visit (HOSPITAL_COMMUNITY)
Admission: RE | Admit: 2023-04-22 | Discharge: 2023-04-22 | Disposition: A | Discharge status: Home / Self Care | Payer: BC Managed Care – PPO | Source: Ambulatory Visit | Attending: Certified Nurse Midwife | Admitting: Certified Nurse Midwife

## 2023-04-22 ENCOUNTER — Ambulatory Visit (INDEPENDENT_AMBULATORY_CARE_PROVIDER_SITE_OTHER): Payer: BC Managed Care – PPO

## 2023-04-22 ENCOUNTER — Other Ambulatory Visit: Payer: BC Managed Care – PPO

## 2023-04-22 DIAGNOSIS — Z3687 Encounter for antenatal screening for uncertain dates: Secondary | ICD-10-CM | POA: Diagnosis not present

## 2023-04-22 DIAGNOSIS — Z3A10 10 weeks gestation of pregnancy: Secondary | ICD-10-CM | POA: Diagnosis not present

## 2023-04-22 DIAGNOSIS — Z130 Encounter for screening for diseases of the blood and blood-forming organs and certain disorders involving the immune mechanism: Secondary | ICD-10-CM

## 2023-04-22 DIAGNOSIS — Z3480 Encounter for supervision of other normal pregnancy, unspecified trimester: Secondary | ICD-10-CM | POA: Insufficient documentation

## 2023-04-22 DIAGNOSIS — Z369 Encounter for antenatal screening, unspecified: Secondary | ICD-10-CM

## 2023-04-22 DIAGNOSIS — Z131 Encounter for screening for diabetes mellitus: Secondary | ICD-10-CM

## 2023-04-23 LAB — CBC/D/PLT+RPR+RH+ABO+RUBIGG...
Antibody Screen: NEGATIVE
Basophils Absolute: 0 10*3/uL (ref 0.0–0.2)
Basos: 0 %
EOS (ABSOLUTE): 0.1 10*3/uL (ref 0.0–0.4)
Eos: 1 %
HCV Ab: NONREACTIVE
HIV Screen 4th Generation wRfx: NONREACTIVE
Hematocrit: 35.1 % (ref 34.0–46.6)
Hemoglobin: 11.4 g/dL (ref 11.1–15.9)
Hepatitis B Surface Ag: NEGATIVE
Immature Grans (Abs): 0 10*3/uL (ref 0.0–0.1)
Immature Granulocytes: 0 %
Lymphocytes Absolute: 1.7 10*3/uL (ref 0.7–3.1)
Lymphs: 24 %
MCH: 28 pg (ref 26.6–33.0)
MCHC: 32.5 g/dL (ref 31.5–35.7)
MCV: 86 fL (ref 79–97)
Monocytes Absolute: 0.7 10*3/uL (ref 0.1–0.9)
Monocytes: 10 %
Neutrophils Absolute: 4.6 10*3/uL (ref 1.4–7.0)
Neutrophils: 65 %
Platelets: 339 10*3/uL (ref 150–450)
RBC: 4.07 x10E6/uL (ref 3.77–5.28)
RDW: 14.2 % (ref 11.7–15.4)
RPR Ser Ql: NONREACTIVE
Rh Factor: POSITIVE
Rubella Antibodies, IGG: 1.15 index (ref >0.99)
Varicella zoster IgG: 822 index (ref >165)
WBC: 7.1 10*3/uL (ref 3.4–10.8)

## 2023-04-23 LAB — URINALYSIS, ROUTINE W REFLEX MICROSCOPIC
Bilirubin, UA: NEGATIVE
Glucose, UA: NEGATIVE
Ketones, UA: NEGATIVE
Nitrite, UA: NEGATIVE
RBC, UA: NEGATIVE
Specific Gravity, UA: 1.019 (ref 1.005–1.030)
Urobilinogen, Ur: 0.2 mg/dL (ref 0.2–1.0)
pH, UA: 6 (ref 5.0–7.5)

## 2023-04-23 LAB — URINE CYTOLOGY ANCILLARY ONLY
Chlamydia: NEGATIVE
Comment: NEGATIVE
Comment: NORMAL
Neisseria Gonorrhea: NEGATIVE

## 2023-04-23 LAB — MICROSCOPIC EXAMINATION
Casts: NONE SEEN /lpf
Epithelial Cells (non renal): >10 /hpf — AB (ref 0–10)

## 2023-04-23 LAB — HCV INTERPRETATION

## 2023-04-23 LAB — HEMOGLOBIN A1C
Est. average glucose Bld gHb Est-mCnc: 128 mg/dL
Hgb A1c MFr Bld: 6.1 % — ABNORMAL HIGH (ref 4.8–5.6)

## 2023-04-24 LAB — MONITOR DRUG PROFILE 14(MW)
Amphetamine Scrn, Ur: NEGATIVE ng/mL
BARBITURATE SCREEN URINE: NEGATIVE ng/mL
BENZODIAZEPINE SCREEN, URINE: NEGATIVE ng/mL
Buprenorphine, Urine: NEGATIVE ng/mL
CANNABINOIDS UR QL SCN: NEGATIVE ng/mL
Cocaine (Metab) Scrn, Ur: NEGATIVE ng/mL
Creatinine(Crt), U: 93.7 mg/dL (ref 20.0–300.0)
Fentanyl, Urine: NEGATIVE pg/mL
Meperidine Screen, Urine: NEGATIVE ng/mL
Methadone Screen, Urine: NEGATIVE ng/mL
OXYCODONE+OXYMORPHONE UR QL SCN: NEGATIVE ng/mL
Opiate Scrn, Ur: NEGATIVE ng/mL
Ph of Urine: 5.7 (ref 4.5–8.9)
Phencyclidine Qn, Ur: NEGATIVE ng/mL
Propoxyphene Scrn, Ur: NEGATIVE ng/mL
SPECIFIC GRAVITY: 1.02
Tramadol Screen, Urine: NEGATIVE ng/mL

## 2023-04-24 LAB — NICOTINE SCREEN, URINE: Cotinine Ql Scrn, Ur: NEGATIVE ng/mL

## 2023-04-25 LAB — URINE CULTURE, OB REFLEX

## 2023-04-25 LAB — CULTURE, OB URINE

## 2023-04-26 LAB — MATERNIT 21 PLUS CORE, BLOOD
Fetal Fraction: 7
Result (T21): NEGATIVE
Trisomy 13 (Patau syndrome): NEGATIVE
Trisomy 18 (Edwards syndrome): NEGATIVE
Trisomy 21 (Down syndrome): NEGATIVE

## 2023-04-29 ENCOUNTER — Other Ambulatory Visit: Payer: Self-pay | Admitting: Certified Nurse Midwife

## 2023-04-29 ENCOUNTER — Other Ambulatory Visit (HOSPITAL_COMMUNITY)
Admission: RE | Admit: 2023-04-29 | Discharge: 2023-04-29 | Disposition: A | Discharge status: Home / Self Care | Payer: BC Managed Care – PPO | Source: Ambulatory Visit | Attending: Certified Nurse Midwife | Admitting: Certified Nurse Midwife

## 2023-04-29 ENCOUNTER — Ambulatory Visit (INDEPENDENT_AMBULATORY_CARE_PROVIDER_SITE_OTHER): Payer: BC Managed Care – PPO | Admitting: Certified Nurse Midwife

## 2023-04-29 ENCOUNTER — Other Ambulatory Visit (INDEPENDENT_AMBULATORY_CARE_PROVIDER_SITE_OTHER): Payer: BC Managed Care – PPO

## 2023-04-29 ENCOUNTER — Encounter: Payer: Self-pay | Admitting: Certified Nurse Midwife

## 2023-04-29 VITALS — BP 122/86 | HR 90 | Wt 251.6 lb

## 2023-04-29 DIAGNOSIS — N898 Other specified noninflammatory disorders of vagina: Secondary | ICD-10-CM

## 2023-04-29 DIAGNOSIS — O26891 Other specified pregnancy related conditions, first trimester: Secondary | ICD-10-CM | POA: Insufficient documentation

## 2023-04-29 DIAGNOSIS — Z124 Encounter for screening for malignant neoplasm of cervix: Secondary | ICD-10-CM

## 2023-04-29 DIAGNOSIS — O368310 Maternal care for abnormalities of the fetal heart rate or rhythm, first trimester, not applicable or unspecified: Secondary | ICD-10-CM

## 2023-04-29 DIAGNOSIS — Z3A12 12 weeks gestation of pregnancy: Secondary | ICD-10-CM

## 2023-04-29 DIAGNOSIS — Z3491 Encounter for supervision of normal pregnancy, unspecified, first trimester: Secondary | ICD-10-CM

## 2023-04-29 DIAGNOSIS — Z3481 Encounter for supervision of other normal pregnancy, first trimester: Secondary | ICD-10-CM

## 2023-04-29 LAB — POCT URINALYSIS DIPSTICK OB
Bilirubin, UA: NEGATIVE
Blood, UA: NEGATIVE
Glucose, UA: NEGATIVE
Ketones, UA: NEGATIVE
Leukocytes, UA: NEGATIVE
Nitrite, UA: NEGATIVE
POC,PROTEIN,UA: NEGATIVE
Spec Grav, UA: <=1.005 — AB (ref 1.010–1.025)
Urobilinogen, UA: 0.2 E.U./dL
pH, UA: 6 (ref 5.0–8.0)

## 2023-04-29 LAB — CERVICOVAGINAL ANCILLARY ONLY
Candida Glabrata: NEGATIVE
Comment: NEGATIVE
Comment: NEGATIVE

## 2023-04-29 MED ORDER — ASPIRIN 81 MG PO TBEC: 81.0000 mg | DELAYED_RELEASE_TABLET | Freq: Every day | ORAL | 12 refills | Status: DC

## 2023-04-29 NOTE — Addendum Note (Signed)
Addended by: Fonda Kinder on: 04/29/2023 11:55 AM   Modules accepted: Orders

## 2023-04-29 NOTE — Progress Notes (Signed)
Body mass index is 46.02 kg/m.

## 2023-04-29 NOTE — Progress Notes (Signed)
NEW OB HISTORY AND PHYSICAL  SUBJECTIVE:       Alisha Morales is a 38 y.o. G23P1101 female, Patient's last menstrual period was 02/03/2023 (exact date)., Estimated Date of Delivery: 11/10/23, [redacted]w[redacted]d, presents today for establishment of Prenatal Care. She has no unusual complaints. She notes yellow , green vaginal discharge.   Hx: hypertension currently being treated with Norvasc 10 mg daily  She has history twin loss at 21 wks  SVD at term x 1   Relationship: female partner Work [employer] [address] Exercise : walking Denies smoking, vaping, alcohol , and drug use.   Gynecologic History Patient's last menstrual period was 02/03/2023 (exact date). Normal Contraception: none Last Pap: 09/10/2022. 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     Prenatal Vit-Fe Fumarate-FA (MULTIVITAMIN-PRENATAL) 27-0.8 MG TABS tablet Take 1 tablet by mouth daily at 12 noon.     No current facility-administered medications on file prior to visit.    No Known Allergies  Social History   Socioeconomic History   Marital status: Single    Spouse name: Not on file   Number of children: 1   Years of education: 13.5   Highest education level: Not on file  Occupational History   Occupation: [employer]  Tobacco Use   Smoking status: Never    Smokeless tobacco: Never  Vaping Use   Vaping Use: Never used  Substance and Sexual Activity   Alcohol use: No   Drug use: No   Sexual activity: Yes    Partners: Male    Birth control/protection: None  Other Topics Concern   Not on file  Social History Narrative   Not on file   Social Determinants of Health   Financial Resource Strain: Medium Risk (04/01/2023)   Overall Financial Resource Strain (CARDIA)    Difficulty of Paying Living Expenses: Somewhat hard  Food Insecurity: No Food Insecurity (04/01/2023)   Hunger Vital Sign    Worried About Running Out of Food in the Last Year: Never true    Ran Out of Food in the Last Year: Never true  Transportation Needs: No Transportation Needs (04/01/2023)   PRAPARE - Administrator, Civil Service (Medical): No    Lack of Transportation (Non-Medical): No  Physical Activity: Inactive (04/01/2023)   Exercise Vital Sign    Days of Exercise per Week: 0 days    Minutes of Exercise per Session: 0 min  Stress: No Stress Concern Present (04/01/2023)   [employer] of Occupational Health - Occupational Stress Questionnaire  Feeling of Stress : Not at all  Social Connections: Unknown (04/01/2023)   Social Connection and Isolation Panel [NHANES]    Frequency of Communication with Friends and Family: More than three times a week    Frequency of Social Gatherings with Friends and Family: More than three times a week    Attends Religious Services: More than 4 times per year    Active Member of [employer] or Organizations: No    Attends Banker Meetings: Never    Marital Status: Not on file  Intimate Partner Violence: Not At Risk (04/01/2023)   Humiliation, Afraid, Rape, and Kick questionnaire    Fear of Current or Ex-Partner: No    Emotionally Abused: No    Physically Abused: No    Sexually Abused: No    Family History  Problem Relation Age of Onset   Stroke Mother    Diabetes Father    Hypercalcemia Father    Hypertension  Father    Healthy Sister    Healthy Brother    Healthy Brother    Healthy Brother    Healthy Brother    Healthy Brother    Diabetes Maternal Grandmother    Hypertension Maternal Grandmother    Hypertension Maternal Grandfather    Diabetes Maternal Grandfather    Healthy Paternal Grandmother    Healthy Paternal Grandfather     The following portions of the patient's history were reviewed and updated as appropriate: allergies, current medications, past OB history, past medical history, past surgical history, past family history, past social history, and problem list.    OBJECTIVE: Initial Physical Exam (New OB)  GENERAL APPEARANCE: alert, well appearing, in no apparent distress, oriented to person, place and time HEAD: normocephalic, atraumatic MOUTH: mucous membranes moist, pharynx normal without lesions THYROID: no thyromegaly or masses present BREASTS: no masses noted, no significant tenderness, no palpable axillary nodes, no skin changes LUNGS: clear to auscultation, no wheezes, rales or rhonchi, symmetric air entry HEART: regular rate and rhythm, no murmurs ABDOMEN: soft, nontender, nondistended, no abnormal masses, no epigastric pain, obese, and FHT not heard EXTREMITIES: no redness or tenderness in the calves or thighs SKIN: normal coloration and turgor, no rashes LYMPH NODES: no adenopathy palpable NEUROLOGIC: alert, oriented, normal speech, no focal findings or movement disorder noted  PELVIC EXAM EXTERNAL GENITALIA: normal appearing vulva with no masses, tenderness or lesions VAGINA: no abnormal discharge or lesions CERVIX: no lesions or cervical motion tenderness, pap collected UTERUS: gravid ADNEXA: no masses palpable and nontender OB EXAM PELVIMETRY: appears adequate RECTUM: exam not indicated  ASSESSMENT: Normal pregnancy  PLAN: Prenatal care See ordersNew OB counseling: The patient has been given an overview regarding routine prenatal care.  Recommendations regarding diet, weight gain, and exercise in pregnancy were given. Prenatal testing, optional genetic testing, carrier screening, and ultrasound use in pregnancy were reviewed. Pt has history of elevated hem A 1 early glucose at 16 wks . Discussed use of Asprin at 14 wks. Orders placed. Benefits of Breast Feeding were discussed. The patient is encouraged to consider nursing her baby post partum.  Doreene Burke, CNM

## 2023-04-29 NOTE — Patient Instructions (Signed)
Oral Glucose Tolerance Test During Pregnancy Why am I having this test? The oral glucose tolerance test (OGTT) is done to check how your body processes blood sugar (glucose). This is one of several tests used to diagnose diabetes that develops during pregnancy (gestational diabetes mellitus). Gestational diabetes is a short-term form of diabetes that some women develop while they are pregnant. It usually occurs during the second trimester of pregnancy and goes away after delivery. Testing, or screening, for gestational diabetes usually occurs at weeks 24-28 of pregnancy. You may have the OGTT test after having a 1-hour glucose screening test if the results from that test indicate that you may have gestational diabetes. This test may also be needed if: You have a history of gestational diabetes. There is a history of giving birth to very large babies or of losing pregnancies (having stillbirths). You have signs and symptoms of diabetes, such as: Changes in your eyesight. Tingling or numbness in your hands or feet. Changes in hunger, thirst, and urination, and these are not explained by your pregnancy. What is being tested? This test measures the amount of glucose in your blood at different times during a period of 3 hours. This shows how well your body can process glucose. What kind of sample is taken?  Blood samples are required for this test. They are usually collected by inserting a needle into a blood vessel. How do I prepare for this test? For 3 days before your test, eat normally. Have plenty of carbohydrate-rich foods. Follow instructions from your health care provider about: Eating or drinking restrictions on the day of the test. You may be asked not to eat or drink anything other than water (to fast) starting 8-10 hours before the test. Changing or stopping your regular medicines. Some medicines may interfere with this test. Tell a health care provider about: All medicines you are  taking, including vitamins, herbs, eye drops, creams, and over-the-counter medicines. Any blood disorders you have. Any surgeries you have had. Any medical conditions you have. What happens during the test? First, your blood glucose will be measured. This is referred to as your fasting blood glucose because you fasted before the test. Then, you will drink a glucose solution that contains a certain amount of glucose. Your blood glucose will be measured again 1, 2, and 3 hours after you drink the solution. This test takes about 3 hours to complete. You will need to stay at the testing location during this time. During the testing period: Do not eat or drink anything other than the glucose solution. Do not exercise. Do not use any products that contain nicotine or tobacco, such as cigarettes, e-cigarettes, and chewing tobacco. These can affect your test results. If you need help quitting, ask your health care provider. The testing procedure may vary among health care providers and hospitals. How are the results reported? Your results will be reported as milligrams of glucose per deciliter of blood (mg/dL) or millimoles per liter (mmol/L). There is more than one source for screening and diagnosis reference values used to diagnose gestational diabetes. Your health care provider will compare your results to normal values that were established after testing a large group of people (reference values). Reference values may vary among labs and hospitals. For this test (Carpenter-Coustan), reference values are: Fasting: 95 mg/dL (5.3 mmol/L). 1 hour: 180 mg/dL (10.0 mmol/L). 2 hour: 155 mg/dL (8.6 mmol/L). 3 hour: 140 mg/dL (7.8 mmol/L). What do the results mean? Results below the reference values are   considered normal. If two or more of your blood glucose levels are at or above the reference values, you may be diagnosed with gestational diabetes. If only one level is high, your health care provider may  suggest repeat testing or other tests to confirm a diagnosis. Talk with your health care provider about what your results mean. Questions to ask your health care provider Ask your health care provider, or the department that is doing the test: When will my results be ready? How will I get my results? What are my treatment options? What other tests do I need? What are my next steps? Summary The oral glucose tolerance test (OGTT) is one of several tests used to diagnose diabetes that develops during pregnancy (gestational diabetes mellitus). Gestational diabetes is a short-term form of diabetes that some women develop while they are pregnant. You may have the OGTT test after having a 1-hour glucose screening test if the results from that test show that you may have gestational diabetes. You may also have this test if you have any symptoms or risk factors for this type of diabetes. Talk with your health care provider about what your results mean. This information is not intended to replace advice given to you by your health care provider. Make sure you discuss any questions you have with your health care provider. Document Revised: 07/24/2022 Document Reviewed: 05/26/2020 Elsevier Patient Education  2023 Elsevier Inc.  

## 2023-04-30 LAB — CERVICOVAGINAL ANCILLARY ONLY
Bacterial Vaginitis (gardnerella): POSITIVE — AB
Candida Vaginitis: POSITIVE — AB
Comment: NEGATIVE

## 2023-05-01 ENCOUNTER — Encounter: Payer: Self-pay | Admitting: Certified Nurse Midwife

## 2023-05-01 ENCOUNTER — Other Ambulatory Visit: Payer: Self-pay | Admitting: Certified Nurse Midwife

## 2023-05-01 LAB — CYTOLOGY - PAP
Adequacy: ABSENT
Comment: NEGATIVE
Diagnosis: NEGATIVE
High risk HPV: NEGATIVE

## 2023-05-01 MED ORDER — MONISTAT 7 COMBO PACK APP 100 & 2 MG-% (9GM) VA KIT: 1.0000 | PACK | Freq: Every day | VAGINAL | 1 refills | Status: AC

## 2023-05-01 MED ORDER — METRONIDAZOLE 0.75 % EX GEL: 1.0000 | Freq: Every day | CUTANEOUS | 0 refills | Status: AC

## 2023-05-09 ENCOUNTER — Telehealth: Payer: Self-pay

## 2023-05-09 NOTE — Telephone Encounter (Signed)
Alisha Morales called triage stating she had BV and was on a medication for it but now she thinks she has a yeast infection with some color discharge. Advised patient to come in for a nurse visit to do a self swab so that we treat correctly. Patient understood and was transferred to front desk

## 2023-05-10 ENCOUNTER — Other Ambulatory Visit (HOSPITAL_COMMUNITY)
Admission: RE | Admit: 2023-05-10 | Discharge: 2023-05-10 | Disposition: A | Discharge status: Home / Self Care | Payer: BC Managed Care – PPO | Source: Ambulatory Visit | Attending: Obstetrics and Gynecology | Admitting: Obstetrics and Gynecology

## 2023-05-10 ENCOUNTER — Ambulatory Visit (INDEPENDENT_AMBULATORY_CARE_PROVIDER_SITE_OTHER): Payer: BC Managed Care – PPO

## 2023-05-10 VITALS — BP 145/91 | HR 86 | Wt 255.4 lb

## 2023-05-10 DIAGNOSIS — N898 Other specified noninflammatory disorders of vagina: Secondary | ICD-10-CM | POA: Diagnosis present

## 2023-05-10 NOTE — Progress Notes (Signed)
    NURSE VISIT NOTE  Subjective:    Patient ID: Alisha Morales, female    DOB: 1985-09-24, 38 y.o.   MRN: [id number]  HPI  Patient is a 38 y.o. G62P1101 female who presents for green and thick vaginal discharge for 1 week(s). Denies abnormal vaginal bleeding or significant pelvic pain or fever. reports cloudy malordorous urine, genital irritation, and vaginal discharge. Patient admits to history of known exposure to STD.   Objective:    BP (!) 145/91   Pulse 86   Wt 255 lb 6.4 oz (115.8 kg)   LMP 02/03/2023 (Exact Date)   BMI 46.71 kg/m      Assessment:   1. Vaginal discharge     rule out GC or chlamydia and nonspecific vaginitis  Plan:   GC and chlamydia DNA  probe sent to lab. Treatment: abstain from coitus during course of treatment and see orders for additional treatments ROV prn if symptoms persist or worsen. Blood pressure readings high at visit today, patient stated that she had not taken medication, patient advised to take medication today and keep log of blood pressure readings to dicuss at her next OB visit.    Fonda Kinder, CMA

## 2023-05-10 NOTE — Patient Instructions (Signed)
Vaginitis  Vaginitis is irritation and swelling of the vagina. Treatment will depend on the cause. What are the causes? It can be caused by: Bacteria. Yeast. A parasite. A virus. Low hormone levels. Bubble baths, scented tampons, and feminine sprays. Other things can change the balance of the yeast and bacteria that live in the vagina. These include: Antibiotic medicines. Not being clean enough. Some birth control methods. Sex. Infection. Diabetes. A weakened body defense system (immune system). What increases the risk? Smoking or being around someone who smokes. Using washes (douches), scented tampons, or scented pads. Wearing tight pants or thong underwear. Using birth control pills or an IUD. Having sex without a condom or having a lot of partners. Having an STI. Using a certain product to kill sperm (nonoxynol-9). Eating foods that are high in sugar. Having diabetes. Having low levels of a female hormone. Having a weakened body defense system. Being pregnant or breastfeeding. What are the signs or symptoms? Fluid coming from the vagina that is not normal. A bad smell. Itching, pain, or swelling. Pain with sex. Pain or burning when you pee (urinate). Sometimes there are no symptoms. How is this treated? Treatment may include: Antibiotic creams or pills. Antifungal medicines. Medicines to ease symptoms if you have a virus. Your sex partner should also be treated. Estrogen medicines. Avoiding scented soaps, sprays, or douches. Stopping use of products that caused irritation and then using a cream to treat symptoms. Follow these instructions at home: Lifestyle Keep the area around your vagina clean and dry. Avoid using soap. Rinse the area with water. Until your doctor says it is okay: Do not use washes for the vagina. Do not use tampons. Do not have sex. Wipe from front to back after going to the bathroom. When your doctor says it is okay, practice safe sex  and use condoms. General instructions Take over-the-counter and prescription medicines only as told by your doctor. If you were prescribed an antibiotic medicine, take or use it as told by your doctor. Do not stop taking or using it even if you start to feel better. Keep all follow-up visits. How is this prevented? Do not use things that can irritate the vagina, such as fabric softeners. Avoid these products if they are scented: Sprays. Detergents. Tampons. Products for cleaning the vagina. Soaps or bubble baths. Let air reach your vagina. To do this: Wear cotton underwear. Do not wear: Underwear while you sleep. Tight pants. Thong underwear. Underwear or nylons without a cotton panel. Take off any wet clothing, such as bathing suits, as soon as you can. Practice safe sex and use condoms. Contact a doctor if: You have pain in your belly or in the area between your hips. You have a fever or chills. Your symptoms last for more than 2-3 days. Get help right away if: You have a fever and your symptoms get worse all of a sudden. Summary Vaginitis is irritation and swelling of the vagina. Treatment will depend on the cause of the condition. Do not use washes or tampons or have sex until your doctor says it is okay. This information is not intended to replace advice given to you by your health care provider. Make sure you discuss any questions you have with your health care provider. Document Revised: 06/16/2020 Document Reviewed: 06/16/2020 Elsevier Patient Education  2023 Elsevier Inc.  

## 2023-05-13 ENCOUNTER — Other Ambulatory Visit: Payer: Self-pay

## 2023-05-13 DIAGNOSIS — B9689 Other specified bacterial agents as the cause of diseases classified elsewhere: Secondary | ICD-10-CM

## 2023-05-13 DIAGNOSIS — B379 Candidiasis, unspecified: Secondary | ICD-10-CM

## 2023-05-13 LAB — CERVICOVAGINAL ANCILLARY ONLY
Bacterial Vaginitis (gardnerella): POSITIVE — AB
Candida Glabrata: NEGATIVE
Candida Vaginitis: POSITIVE — AB
Chlamydia: NEGATIVE
Comment: NEGATIVE
Comment: NEGATIVE
Comment: NEGATIVE
Comment: NEGATIVE
Comment: NEGATIVE
Comment: NORMAL
Neisseria Gonorrhea: NEGATIVE
Trichomonas: NEGATIVE

## 2023-05-13 MED ORDER — MONISTAT 7 COMBO PACK APP 100 & 2 MG-% (9GM) VA KIT
1.0000 | PACK | Freq: Every day | VAGINAL | 1 refills | Status: AC
Start: 2023-05-13 — End: 2023-05-20

## 2023-05-13 MED ORDER — METRONIDAZOLE 500 MG PO TABS: 500.0000 mg | ORAL_TABLET | Freq: Two times a day (BID) | ORAL | 0 refills | Status: DC

## 2023-05-14 ENCOUNTER — Encounter: Payer: Self-pay | Admitting: Obstetrics

## 2023-05-14 ENCOUNTER — Other Ambulatory Visit: Payer: Self-pay | Admitting: Obstetrics

## 2023-05-14 MED ORDER — NIFEDIPINE ER OSMOTIC RELEASE 30 MG PO TB24: 30.0000 mg | ORAL_TABLET | Freq: Every day | ORAL | 12 refills | Status: DC

## 2023-05-14 NOTE — Progress Notes (Signed)
Rx sent for nifedipine XL 30 mL PO daily.   Glenetta Borg, CNM

## 2023-05-20 ENCOUNTER — Other Ambulatory Visit: Payer: Self-pay

## 2023-05-20 ENCOUNTER — Ambulatory Visit (INDEPENDENT_AMBULATORY_CARE_PROVIDER_SITE_OTHER): Payer: BC Managed Care – PPO | Admitting: Obstetrics

## 2023-05-20 ENCOUNTER — Encounter: Payer: Self-pay | Admitting: Obstetrics

## 2023-05-20 ENCOUNTER — Other Ambulatory Visit: Payer: BC Managed Care – PPO

## 2023-05-20 VITALS — BP 136/87 | HR 82 | Wt 257.0 lb

## 2023-05-20 DIAGNOSIS — Z131 Encounter for screening for diabetes mellitus: Secondary | ICD-10-CM

## 2023-05-20 DIAGNOSIS — Z3482 Encounter for supervision of other normal pregnancy, second trimester: Secondary | ICD-10-CM

## 2023-05-20 DIAGNOSIS — O09293 Supervision of pregnancy with other poor reproductive or obstetric history, third trimester: Secondary | ICD-10-CM

## 2023-05-20 DIAGNOSIS — Z3480 Encounter for supervision of other normal pregnancy, unspecified trimester: Secondary | ICD-10-CM

## 2023-05-20 DIAGNOSIS — O0992 Supervision of high risk pregnancy, unspecified, second trimester: Secondary | ICD-10-CM

## 2023-05-20 DIAGNOSIS — O162 Unspecified maternal hypertension, second trimester: Secondary | ICD-10-CM

## 2023-05-20 DIAGNOSIS — Z8759 Personal history of other complications of pregnancy, childbirth and the puerperium: Secondary | ICD-10-CM

## 2023-05-20 DIAGNOSIS — Z3A15 15 weeks gestation of pregnancy: Secondary | ICD-10-CM

## 2023-05-20 LAB — POCT URINALYSIS DIPSTICK OB
Bilirubin, UA: NEGATIVE
Blood, UA: NEGATIVE
Glucose, UA: NEGATIVE
Ketones, UA: NEGATIVE
Leukocytes, UA: NEGATIVE
Nitrite, UA: NEGATIVE
POC,PROTEIN,UA: NEGATIVE
Spec Grav, UA: 1.01 (ref 1.010–1.025)
Urobilinogen, UA: 0.2 E.U./dL
pH, UA: 6.5 (ref 5.0–8.0)

## 2023-05-20 NOTE — Progress Notes (Signed)
ROB at [redacted]w[redacted]d. Alisha Morales is feeling well overall but struggling with recurrent yeast and BV. Discussed ways to reduce frequency of infection. Currently taking metronidazole and has finished a course of Monistat. Discussed changing her BP medication to Procardia XL 30 mg daily. She is in agreement with this plan. Early 1-hour glucose done today. MFM anatomy scan ordered. RTC in 4 weeks.  Glenetta Borg, CNM

## 2023-05-21 LAB — GLUCOSE, 1 HOUR GESTATIONAL: Gestational Diabetes Screen: 98 mg/dL (ref 70–139)

## 2023-05-21 NOTE — Addendum Note (Signed)
Addended by: Kathlene Cote on: 05/21/2023 03:41 PM   Modules accepted: Orders

## 2023-05-25 ENCOUNTER — Encounter: Payer: Self-pay | Admitting: Obstetrics

## 2023-06-17 ENCOUNTER — Ambulatory Visit (INDEPENDENT_AMBULATORY_CARE_PROVIDER_SITE_OTHER): Payer: BC Managed Care – PPO | Admitting: Obstetrics

## 2023-06-17 VITALS — BP 114/74 | HR 96 | Wt 256.9 lb

## 2023-06-17 DIAGNOSIS — Z3482 Encounter for supervision of other normal pregnancy, second trimester: Secondary | ICD-10-CM

## 2023-06-17 DIAGNOSIS — Z3A19 19 weeks gestation of pregnancy: Secondary | ICD-10-CM

## 2023-06-17 LAB — POCT URINALYSIS DIPSTICK OB
Bilirubin, UA: NEGATIVE
Blood, UA: NEGATIVE
Glucose, UA: NEGATIVE
Ketones, UA: NEGATIVE
Nitrite, UA: NEGATIVE
Spec Grav, UA: 1.025 (ref 1.010–1.025)
Urobilinogen, UA: 0.2 E.U./dL
pH, UA: 5 (ref 5.0–8.0)

## 2023-06-17 NOTE — Progress Notes (Signed)
Body mass index is 46.99 kg/m.

## 2023-06-17 NOTE — Progress Notes (Signed)
Routine Prenatal Care Visit  Subjective  Alisha Morales is a 38 y.o. G3P1101 at [redacted]w[redacted]d being seen today for ongoing prenatal care.  She is currently monitored for the following issues for this high-risk pregnancy and has Supervision of other normal pregnancy, antepartum on their problem list.  ----------------------------------------------------------------------------------- Patient reports no complaints.  She is feeling fetal movement. Contractions: Not present. Vag. Bleeding: None.  Movement: Present. Leaking Fluid denies.  ----------------------------------------------------------------------------------- The following portions of the patient's history were reviewed and updated as appropriate: allergies, current medications, past family history, past medical history, past social history, past surgical history and problem list. Problem list updated.  Objective  Blood pressure 114/74, pulse 96, weight 256 lb 14.4 oz (116.5 kg), last menstrual period 02/03/2023. Pregravid weight 230 lb (104.3 kg) Total Weight Gain 26 lb 14.4 oz (12.2 kg) Urinalysis: Urine Protein    Urine Glucose    Fetal Status: Fetal Heart Rate (bpm): 156   Movement: Present     General:  Alert, oriented and cooperative. Patient is in no acute distress.  Skin: Skin is warm and dry. No rash noted.   Cardiovascular: Normal heart rate noted  Respiratory: Normal respiratory effort, no problems with respiration noted  Abdomen: Soft, gravid, appropriate for gestational age. Pain/Pressure: Absent     Pelvic:  Cervical exam deferred        Extremities: Normal range of motion.  Edema: None  Mental Status: Normal mood and affect. Normal behavior. Normal judgment and thought content.   Assessment   38 y.o. G3P1101 at [redacted]w[redacted]d by  11/10/2023, by Last Menstrual Period presenting for routine prenatal visit  Plan   third Problems (from 04/01/23 to present)     Problem Noted Resolved   Supervision of other normal pregnancy,  antepartum 04/01/2023 by Loran Senters, CMA No   Overview Addendum 04/01/2023  1:39 PM by Loran Senters, CMA     Clinical Staff Provider  Office Location  [facility] Dating  Not found.  Language  English Anatomy US    Flu Vaccine  offer Genetic Screen  NIPS:   TDaP vaccine   offer Hgb A1C or  GTT Early : Third trimester :   Covid No boosters   LAB RESULTS   Rhogam     Blood Type     Feeding Plan formal Antibody    Contraception pill Rubella    Circumcision yes RPR     Pediatrician  [facility]     Support Person Antonio and 2 friends HIV    Prenatal Classes no Varicella     GBS  (For PCN allergy, check sensitivities)   BTL Consent  Hep C     VBAC Consent  Pap No results found for: "DIAGPAP"    Hgb Electro      CF      SMA                   Preterm labor symptoms and general obstetric precautions including but not limited to vaginal bleeding, contractions, leaking of fluid and fetal movement were reviewed in detail with the patient. Please refer to After Visit Summary for other counseling recommendations.  Her anatomy scan is ordered, but will take place at 21 weeks due to her need for Monday appointment. She continues taking both her Procardia and a daily aspirin.  Return in about 4 weeks (around 07/15/2023) for return OB.  Mirna Mires, CNM  06/17/2023 11:47 AM

## 2023-07-01 ENCOUNTER — Ambulatory Visit: Payer: BC Managed Care – PPO | Attending: Obstetrics

## 2023-07-01 ENCOUNTER — Other Ambulatory Visit: Payer: Self-pay

## 2023-07-01 VITALS — BP 134/86 | HR 99 | Temp 99.0°F | Ht 62.0 in | Wt 260.5 lb

## 2023-07-01 DIAGNOSIS — O09522 Supervision of elderly multigravida, second trimester: Secondary | ICD-10-CM | POA: Insufficient documentation

## 2023-07-01 DIAGNOSIS — Z3A21 21 weeks gestation of pregnancy: Secondary | ICD-10-CM

## 2023-07-01 DIAGNOSIS — O3412 Maternal care for benign tumor of corpus uteri, second trimester: Secondary | ICD-10-CM | POA: Insufficient documentation

## 2023-07-01 DIAGNOSIS — O162 Unspecified maternal hypertension, second trimester: Secondary | ICD-10-CM

## 2023-07-01 DIAGNOSIS — D259 Leiomyoma of uterus, unspecified: Secondary | ICD-10-CM

## 2023-07-01 DIAGNOSIS — E669 Obesity, unspecified: Secondary | ICD-10-CM

## 2023-07-01 DIAGNOSIS — O10912 Unspecified pre-existing hypertension complicating pregnancy, second trimester: Secondary | ICD-10-CM | POA: Insufficient documentation

## 2023-07-01 DIAGNOSIS — O09292 Supervision of pregnancy with other poor reproductive or obstetric history, second trimester: Secondary | ICD-10-CM | POA: Insufficient documentation

## 2023-07-01 DIAGNOSIS — Z8759 Personal history of other complications of pregnancy, childbirth and the puerperium: Secondary | ICD-10-CM | POA: Insufficient documentation

## 2023-07-01 DIAGNOSIS — Z363 Encounter for antenatal screening for malformations: Secondary | ICD-10-CM | POA: Insufficient documentation

## 2023-07-01 DIAGNOSIS — O99212 Obesity complicating pregnancy, second trimester: Secondary | ICD-10-CM

## 2023-07-01 DIAGNOSIS — O10012 Pre-existing essential hypertension complicating pregnancy, second trimester: Secondary | ICD-10-CM | POA: Diagnosis not present

## 2023-07-01 DIAGNOSIS — O09212 Supervision of pregnancy with history of pre-term labor, second trimester: Secondary | ICD-10-CM

## 2023-07-01 DIAGNOSIS — Z3480 Encounter for supervision of other normal pregnancy, unspecified trimester: Secondary | ICD-10-CM

## 2023-07-02 ENCOUNTER — Encounter: Payer: Self-pay | Admitting: Obstetrics and Gynecology

## 2023-07-02 ENCOUNTER — Other Ambulatory Visit: Payer: Self-pay

## 2023-07-02 ENCOUNTER — Observation Stay
Admission: EM | Admit: 2023-07-02 | Discharge: 2023-07-02 | Disposition: A | Discharge status: Home / Self Care | Payer: BC Managed Care – PPO | Attending: Certified Nurse Midwife | Admitting: Certified Nurse Midwife

## 2023-07-02 DIAGNOSIS — Z3A21 21 weeks gestation of pregnancy: Secondary | ICD-10-CM | POA: Diagnosis not present

## 2023-07-02 DIAGNOSIS — Z7982 Long term (current) use of aspirin: Secondary | ICD-10-CM | POA: Insufficient documentation

## 2023-07-02 DIAGNOSIS — Z79899 Other long term (current) drug therapy: Secondary | ICD-10-CM | POA: Diagnosis not present

## 2023-07-02 DIAGNOSIS — N898 Other specified noninflammatory disorders of vagina: Secondary | ICD-10-CM | POA: Diagnosis not present

## 2023-07-02 DIAGNOSIS — O99891 Other specified diseases and conditions complicating pregnancy: Secondary | ICD-10-CM | POA: Diagnosis not present

## 2023-07-02 DIAGNOSIS — O2392 Unspecified genitourinary tract infection in pregnancy, second trimester: Principal | ICD-10-CM | POA: Insufficient documentation

## 2023-07-02 DIAGNOSIS — Z3480 Encounter for supervision of other normal pregnancy, unspecified trimester: Secondary | ICD-10-CM

## 2023-07-02 DIAGNOSIS — O09522 Supervision of elderly multigravida, second trimester: Secondary | ICD-10-CM | POA: Insufficient documentation

## 2023-07-02 LAB — CBC
HCT: 29.5 % — ABNORMAL LOW (ref 36.0–46.0)
Hemoglobin: 9.6 g/dL — ABNORMAL LOW (ref 12.0–15.0)
MCH: 28.3 pg (ref 26.0–34.0)
MCHC: 32.5 g/dL (ref 30.0–36.0)
MCV: 87 fL (ref 80.0–100.0)
Platelets: 277 10*3/uL (ref 150–400)
RBC: 3.39 MIL/uL — ABNORMAL LOW (ref 3.87–5.11)
RDW: 14.4 % (ref 11.5–15.5)
WBC: 8.9 10*3/uL (ref 4.0–10.5)
nRBC: 0 % (ref 0.0–0.2)

## 2023-07-02 LAB — URINALYSIS, ROUTINE W REFLEX MICROSCOPIC
Bilirubin Urine: NEGATIVE
Glucose, UA: NEGATIVE mg/dL
Hgb urine dipstick: NEGATIVE
Ketones, ur: NEGATIVE mg/dL
Nitrite: NEGATIVE
Protein, ur: NEGATIVE mg/dL
Specific Gravity, Urine: 1.017 (ref 1.005–1.030)
pH: 5 (ref 5.0–8.0)

## 2023-07-02 LAB — CHLAMYDIA/NGC RT PCR (ARMC ONLY)
Chlamydia Tr: NOT DETECTED
N gonorrhoeae: NOT DETECTED

## 2023-07-02 LAB — WET PREP, GENITAL
Clue Cells Wet Prep HPF POC: NONE SEEN
Sperm: NONE SEEN
Trich, Wet Prep: NONE SEEN
WBC, Wet Prep HPF POC: <10 (ref <10)
Yeast Wet Prep HPF POC: NONE SEEN

## 2023-07-02 MED ORDER — LACTATED RINGERS IV SOLN: 500.0000 mL | INTRAVENOUS | Status: DC | PRN

## 2023-07-02 MED ORDER — LACTATED RINGERS IV SOLN: INTRAVENOUS | Status: DC

## 2023-07-02 NOTE — OB Triage Note (Addendum)
    L&D OB Triage Note  SUBJECTIVE Alisha Morales is a 38 y.o. G6P1101 female at [redacted]w[redacted]d, EDD Estimated Date of Delivery: 11/10/23 who presented to triage with complaints of vaginal discharge. She has history of recurrent yeast infection with this pregnancy. She denies contractions, loss of fluid, vaginal bleeding, and is feeling fetal movement.    OB History  Gravida Para Term Preterm AB Living  3 2 1 1  0 1  SAB IAB Ectopic Multiple Live Births  0 0 0 0 1    # Outcome Date GA Lbr Len/2nd Weight Sex Delivery Anes PTL Lv  3 Current           2 Term 08/08/09 [redacted]w[redacted]d  3827 g M Vag-Spont  N LIV  1 Preterm 2005 [redacted]w[redacted]d    Vag-Spont   FD     Birth Comments: TWINS    Medications Prior to Admission  Medication Sig Dispense Refill Last Dose   aspirin EC 81 MG tablet Take 1 tablet (81 mg total) by mouth daily. Swallow whole. 30 tablet 12 07/02/2023   NIFEdipine (PROCARDIA-XL/NIFEDICAL-XL) 30 MG 24 hr tablet Take 1 tablet (30 mg total) by mouth daily. Can increase to twice a day as needed for symptomatic contractions 90 tablet 12 07/02/2023   Prenatal Vit-Fe Fumarate-FA (MULTIVITAMIN-PRENATAL) 27-0.8 MG TABS tablet Take 1 tablet by mouth daily at 12 noon.   07/02/2023   amLODipine (NORVASC) 10 MG tablet Take 10 mg by mouth daily. (Patient not taking: Reported on 07/01/2023)        OBJECTIVE  Nursing Evaluation:   BP 131/69 (BP Location: Left Arm)   Pulse 75   Temp 98.6 F (37 C) (Oral)   Resp 16   Ht 5\' 2"  (1.575 m)   Wt 117.9 kg   LMP 02/03/2023 (Exact Date)   BMI 47.55 kg/m    Findings:       Wet prep negative    Gc/chlamydia negative    U/a : moderate leukocytes, urine culture ordered.     NST INTERPRETATION: FHT appropriate for gestational age   Mode: Doppler Baseline Rate (A): 161 bpm (fht)   Abdomen soft.   ASSESSMENT Impression:  1.  Pregnancy:  G3P1101 at [redacted]w[redacted]d , EDD Estimated Date of Delivery: 11/10/23 2.  Reassuring fetal and maternal status 3.  Vaginal discharge in  pregnancy  PLAN 1. Current condition and above findings reviewed.  Reassuring fetal and maternal condition. Discussed normal discharge in pregnancy and increased amount. Reassurance given.  2. Discharge home with standard labor precautions given to return to L&D or call the office for problems. 3. Continue routine prenatal care.    I was present and evaluated this pt in person.   Doreene Burke, CNM

## 2023-07-02 NOTE — OB Triage Note (Signed)
Patient is a 38 yo, G3P1, at 21 weeks 2 days. Patient presents with complaints of vaginal discharge. Patient states she lost her mucous plug earlier today. Patient also reports recurrent yeast infections with this pregnancy that she has been treating with OTC monistat. Patient denies any vaginal bleeding or LOF. Patient denies any contractions. Patient reports +FM. VSS however temp elevated at 100.4 F . Initial fetal heart tone 161 via doppler. Janee Morn CNM notified of patients arrival to unit. Plan to place in observation for wet prep, UA, G/C, and CBC.

## 2023-07-04 LAB — URINE CULTURE: Culture: 20000 — AB

## 2023-07-05 ENCOUNTER — Encounter: Payer: Self-pay | Admitting: Certified Nurse Midwife

## 2023-07-05 ENCOUNTER — Other Ambulatory Visit: Payer: Self-pay | Admitting: Certified Nurse Midwife

## 2023-07-05 MED ORDER — AMOXICILLIN 875 MG PO TABS: 875.0000 mg | ORAL_TABLET | Freq: Two times a day (BID) | ORAL | 0 refills | Status: AC

## 2023-07-15 ENCOUNTER — Encounter: Payer: Self-pay | Admitting: Licensed Practical Nurse

## 2023-07-15 ENCOUNTER — Ambulatory Visit (INDEPENDENT_AMBULATORY_CARE_PROVIDER_SITE_OTHER): Payer: BC Managed Care – PPO | Admitting: Licensed Practical Nurse

## 2023-07-15 VITALS — BP 132/70 | HR 66 | Wt 263.2 lb

## 2023-07-15 DIAGNOSIS — Z131 Encounter for screening for diabetes mellitus: Secondary | ICD-10-CM

## 2023-07-15 DIAGNOSIS — Z3482 Encounter for supervision of other normal pregnancy, second trimester: Secondary | ICD-10-CM

## 2023-07-15 DIAGNOSIS — Z3A23 23 weeks gestation of pregnancy: Secondary | ICD-10-CM

## 2023-07-15 DIAGNOSIS — Z3480 Encounter for supervision of other normal pregnancy, unspecified trimester: Secondary | ICD-10-CM

## 2023-07-15 LAB — POCT URINALYSIS DIPSTICK
Bilirubin, UA: NEGATIVE
Blood, UA: NEGATIVE
Glucose, UA: NEGATIVE
Ketones, UA: NEGATIVE
Leukocytes, UA: NEGATIVE
Nitrite, UA: NEGATIVE
Protein, UA: NEGATIVE
Spec Grav, UA: 1.015 (ref 1.010–1.025)
Urobilinogen, UA: 0.2 E.U./dL
pH, UA: 8 (ref 5.0–8.0)

## 2023-07-15 NOTE — Progress Notes (Signed)
Routine Prenatal Care Visit  Subjective  Alisha Morales is a 38 y.o. G3P1101 at [redacted]w[redacted]d being seen today for ongoing prenatal care.  She is currently monitored for the following issues for this high-risk pregnancy and has Supervision of other normal pregnancy, antepartum and Labor and delivery, indication for care on their problem list.  ----------------------------------------------------------------------------------- Patient reports no complaints.  Doing well. Had some pain on her lower right side, Using a support belt which has helped the pain.  -has growth scan scheduled -not planing to travel this summer  -plans to formula feed -reviewed GDM screening at next visit  -initial BP 151.89, repeat 132/70. Reports home blood pressures are 130's/70's, she takes Procardia in the evening. Will keep Procardia at 30mg  xl Daily   Contractions: Not present. Vag. Bleeding: None.  Movement: Present. Leaking Fluid denies.  ----------------------------------------------------------------------------------- The following portions of the patient's history were reviewed and updated as appropriate: allergies, current medications, past family history, past medical history, past social history, past surgical history and problem list. Problem list updated.  Objective  Blood pressure 132/70, pulse 66, weight 263 lb 3.2 oz (119.4 kg), last menstrual period 02/03/2023. Pregravid weight 230 lb (104.3 kg) Total Weight Gain 33 lb 3.2 oz (15.1 kg) Urinalysis: Urine Protein    Urine Glucose    Fetal Status: Fetal Heart Rate (bpm): 150 Fundal Height: 33 cm Movement: Present     General:  Alert, oriented and cooperative. Patient is in no acute distress.  Skin: Skin is warm and dry. No rash noted.   Cardiovascular: Normal heart rate noted  Respiratory: Normal respiratory effort, no problems with respiration noted  Abdomen: Soft, gravid, appropriate for gestational age. Pain/Pressure: Absent     Pelvic:  Cervical exam  deferred        Extremities: Normal range of motion.  Edema: Mild pitting, slight indentation  Mental Status: Normal mood and affect. Normal behavior. Normal judgment and thought content.   Assessment   38 y.o. G3P1101 at [redacted]w[redacted]d by  11/10/2023, by Last Menstrual Period presenting for routine prenatal visit  Plan   third Problems (from 04/01/23 to present)     Problem Noted Resolved   Supervision of other normal pregnancy, antepartum 04/01/2023 by Loran Senters, CMA No   Overview Addendum 04/01/2023  1:39 PM by Loran Senters, CMA     Clinical Staff Provider  Office Location  [facility] Dating  Not found.  Language  English Anatomy US    Flu Vaccine  offer Genetic Screen  NIPS:   TDaP vaccine   offer Hgb A1C or  GTT Early : Third trimester :   Covid No boosters   LAB RESULTS   Rhogam     Blood Type     Feeding Plan formal Antibody    Contraception pill Rubella    Circumcision yes RPR     Pediatrician  [facility]     Support Person Antonio and 2 friends HIV    Prenatal Classes no Varicella     GBS  (For PCN allergy, check sensitivities)   BTL Consent  Hep C     VBAC Consent  Pap No results found for: "DIAGPAP"    Hgb Electro      CF      SMA                    Preterm labor symptoms and general obstetric precautions including but not limited to vaginal bleeding, contractions, leaking of fluid and fetal  movement were reviewed in detail with the patient. Please refer to After Visit Summary for other counseling recommendations.   Return for ROB, 28 wks labs .  Carie Caddy, CNM   [facility]  07/15/23  11:51 AM

## 2023-07-26 ENCOUNTER — Other Ambulatory Visit: Payer: Self-pay | Admitting: Obstetrics

## 2023-07-26 DIAGNOSIS — Z362 Encounter for other antenatal screening follow-up: Secondary | ICD-10-CM

## 2023-07-26 DIAGNOSIS — O10012 Pre-existing essential hypertension complicating pregnancy, second trimester: Secondary | ICD-10-CM

## 2023-07-31 ENCOUNTER — Other Ambulatory Visit: Payer: Self-pay

## 2023-07-31 ENCOUNTER — Ambulatory Visit: Payer: BC Managed Care – PPO | Attending: Obstetrics and Gynecology

## 2023-07-31 VITALS — BP 124/78 | HR 88 | Temp 98.1°F | Ht 62.0 in | Wt 267.5 lb

## 2023-07-31 DIAGNOSIS — O99212 Obesity complicating pregnancy, second trimester: Secondary | ICD-10-CM | POA: Diagnosis not present

## 2023-07-31 DIAGNOSIS — E669 Obesity, unspecified: Secondary | ICD-10-CM

## 2023-07-31 DIAGNOSIS — O3412 Maternal care for benign tumor of corpus uteri, second trimester: Secondary | ICD-10-CM | POA: Insufficient documentation

## 2023-07-31 DIAGNOSIS — O10012 Pre-existing essential hypertension complicating pregnancy, second trimester: Secondary | ICD-10-CM | POA: Insufficient documentation

## 2023-07-31 DIAGNOSIS — Z3A25 25 weeks gestation of pregnancy: Secondary | ICD-10-CM | POA: Diagnosis not present

## 2023-07-31 DIAGNOSIS — O09522 Supervision of elderly multigravida, second trimester: Secondary | ICD-10-CM | POA: Diagnosis not present

## 2023-07-31 DIAGNOSIS — O09219 Supervision of pregnancy with history of pre-term labor, unspecified trimester: Secondary | ICD-10-CM | POA: Insufficient documentation

## 2023-07-31 DIAGNOSIS — D259 Leiomyoma of uterus, unspecified: Secondary | ICD-10-CM | POA: Diagnosis not present

## 2023-07-31 DIAGNOSIS — Z362 Encounter for other antenatal screening follow-up: Secondary | ICD-10-CM | POA: Diagnosis not present

## 2023-07-31 DIAGNOSIS — O09212 Supervision of pregnancy with history of pre-term labor, second trimester: Secondary | ICD-10-CM | POA: Diagnosis not present

## 2023-07-31 DIAGNOSIS — Z3480 Encounter for supervision of other normal pregnancy, unspecified trimester: Secondary | ICD-10-CM

## 2023-08-12 ENCOUNTER — Other Ambulatory Visit: Payer: BC Managed Care – PPO

## 2023-08-12 ENCOUNTER — Ambulatory Visit: Payer: BC Managed Care – PPO | Admitting: Obstetrics

## 2023-08-12 VITALS — BP 118/75 | HR 93 | Wt 267.2 lb

## 2023-08-12 DIAGNOSIS — O10913 Unspecified pre-existing hypertension complicating pregnancy, third trimester: Secondary | ICD-10-CM | POA: Insufficient documentation

## 2023-08-12 DIAGNOSIS — Z3480 Encounter for supervision of other normal pregnancy, unspecified trimester: Secondary | ICD-10-CM

## 2023-08-12 DIAGNOSIS — O10919 Unspecified pre-existing hypertension complicating pregnancy, unspecified trimester: Secondary | ICD-10-CM

## 2023-08-12 DIAGNOSIS — O10012 Pre-existing essential hypertension complicating pregnancy, second trimester: Secondary | ICD-10-CM

## 2023-08-12 DIAGNOSIS — Z3482 Encounter for supervision of other normal pregnancy, second trimester: Secondary | ICD-10-CM

## 2023-08-12 DIAGNOSIS — Z131 Encounter for screening for diabetes mellitus: Secondary | ICD-10-CM

## 2023-08-12 DIAGNOSIS — Z3A27 27 weeks gestation of pregnancy: Secondary | ICD-10-CM

## 2023-08-12 LAB — POCT URINALYSIS DIPSTICK OB
Bilirubin, UA: NEGATIVE
Blood, UA: NEGATIVE
Glucose, UA: NEGATIVE
Ketones, UA: NEGATIVE
Nitrite, UA: NEGATIVE
POC,PROTEIN,UA: NEGATIVE
Spec Grav, UA: <=1.005 — AB (ref 1.010–1.025)
Urobilinogen, UA: 0.2 E.U./dL
pH, UA: 7 (ref 5.0–8.0)

## 2023-08-12 NOTE — Progress Notes (Signed)
ROB [redacted]w[redacted]d: She is doing well. She report good fetal movement. She has no new concerns today.

## 2023-08-12 NOTE — Progress Notes (Signed)
Routine Prenatal Care Visit  Subjective  Alisha Morales is a 38 y.o. G3P1101 at [redacted]w[redacted]d being seen today for ongoing prenatal care.  She is currently monitored for the following issues for this high-risk pregnancy and has Supervision of other normal pregnancy, antepartum and Chronic hypertension affecting pregnancy on their problem list.  ----------------------------------------------------------------------------------- Patient reports no complaints.  Here for her 28 week labs Contractions: Not present. Vag. Bleeding: None.  Movement: Present. Leaking Fluid denies.  ----------------------------------------------------------------------------------- The following portions of the patient's history were reviewed and updated as appropriate: allergies, current medications, past family history, past medical history, past social history, past surgical history and problem list. Problem list updated.  Objective  Blood pressure 118/75, pulse 93, weight 267 lb 3.2 oz (121.2 kg), last menstrual period 02/03/2023. Pregravid weight 230 lb (104.3 kg) Total Weight Gain 37 lb 3.2 oz (16.9 kg) Urinalysis: Urine Protein Negative  Urine Glucose Negative  Fetal Status:     Movement: Present     General:  Alert, oriented and cooperative. Patient is in no acute distress.  Skin: Skin is warm and dry. No rash noted.   Cardiovascular: Normal heart rate noted  Respiratory: Normal respiratory effort, no problems with respiration noted  Abdomen: Soft, gravid, appropriate for gestational age. Pain/Pressure: Absent     Pelvic:  Cervical exam deferred        Extremities: Normal range of motion.  Edema: None  Mental Status: Normal mood and affect. Normal behavior. Normal judgment and thought content.   Assessment   38 y.o. G3P1101 at [redacted]w[redacted]d by  11/10/2023, by Last Menstrual Period presenting for routine prenatal visit Higher risk pregnancy- Chronic HTN and high BMI. 28 wk lab draw today.  Plan   third Problems  (from 04/01/23 to present)     Problem Noted Resolved   Supervision of other normal pregnancy, antepartum 04/01/2023 by Loran Senters, CMA No   Overview Addendum 08/12/2023 10:19 AM by Mirna Mires, CNM     Clinical Staff Provider  Office Location  [facility] Dating  L and 10wkUS  Language  English Anatomy US  Incomplete views   Flu Vaccine  offer Genetic Screen  NIPS:   TDaP vaccine   offer Hgb A1C or  GTT Early : Third trimester : drawn 8/12  Covid No boosters   LAB RESULTS   Rhogam  O/Positive/-- (04/22 0955)  Blood Type O/Positive/-- (04/22 0955)   Feeding Plan formal Antibody Negative (04/22 0955)  Contraception pill Rubella 1.15 (04/22 0955)  Circumcision yes RPR Non Reactive (04/22 0955)   Pediatrician  [facility]Ag Negative (04/22 0955)   Support Person Antonio and 2 friends HIV Non Reactive (04/22 0955)  Prenatal Classes no Varicella Immune    GBS  (For PCN allergy, check sensitivities)   BTL Consent  Hep C Non Reactive (04/22 0955)   VBAC Consent  Pap Diagnosis  Date Value Ref Range Status  04/29/2023   Final   - Negative for intraepithelial lesion or malignancy (NILM)      Hgb Electro      CF      SMA                    Preterm labor symptoms and general obstetric precautions including but not limited to vaginal bleeding, contractions, leaking of fluid and fetal movement were reviewed in detail with the patient. Please refer to After Visit Summary for other counseling recommendations.   Return in about 2 weeks (around 08/26/2023)  for return OB, review her 28 wk labs.  Mirna Mires, CNM  08/12/2023 10:21 AM

## 2023-08-17 ENCOUNTER — Other Ambulatory Visit: Payer: Self-pay | Admitting: Licensed Practical Nurse

## 2023-08-17 DIAGNOSIS — Z3480 Encounter for supervision of other normal pregnancy, unspecified trimester: Secondary | ICD-10-CM

## 2023-08-17 DIAGNOSIS — O99810 Abnormal glucose complicating pregnancy: Secondary | ICD-10-CM

## 2023-08-17 NOTE — Progress Notes (Signed)
Pt had elevated 1 hour, mychart message sent to schedule 3 hour glucose test.  Order placed Carie Caddy, CNM  [facility]  08/17/23  11:11 PM

## 2023-08-21 ENCOUNTER — Other Ambulatory Visit: Payer: BC Managed Care – PPO

## 2023-08-23 ENCOUNTER — Other Ambulatory Visit: Payer: BC Managed Care – PPO

## 2023-08-23 DIAGNOSIS — O99810 Abnormal glucose complicating pregnancy: Secondary | ICD-10-CM

## 2023-08-23 DIAGNOSIS — Z3480 Encounter for supervision of other normal pregnancy, unspecified trimester: Secondary | ICD-10-CM

## 2023-08-24 LAB — GESTATIONAL GLUCOSE TOLERANCE
Glucose, Fasting: 93 mg/dL (ref 70–94)
Glucose, GTT - 1 Hour: 150 mg/dL (ref 70–179)
Glucose, GTT - 2 Hour: 107 mg/dL (ref 70–154)
Glucose, GTT - 3 Hour: 55 mg/dL — ABNORMAL LOW (ref 70–139)

## 2023-08-26 ENCOUNTER — Ambulatory Visit: Payer: BC Managed Care – PPO | Admitting: Advanced Practice Midwife

## 2023-08-26 ENCOUNTER — Encounter: Payer: Self-pay | Admitting: Advanced Practice Midwife

## 2023-08-26 VITALS — BP 125/87 | HR 81 | Wt 267.0 lb

## 2023-08-26 DIAGNOSIS — Z3A29 29 weeks gestation of pregnancy: Secondary | ICD-10-CM

## 2023-08-26 DIAGNOSIS — O99210 Obesity complicating pregnancy, unspecified trimester: Secondary | ICD-10-CM

## 2023-08-26 DIAGNOSIS — O0993 Supervision of high risk pregnancy, unspecified, third trimester: Secondary | ICD-10-CM

## 2023-08-26 NOTE — Progress Notes (Signed)
Routine Prenatal Care Visit  Subjective  Alisha Morales is a 38 y.o. G3P1101 at [redacted]w[redacted]d being seen today for ongoing prenatal care.  She is currently monitored for the following issues for this high-risk pregnancy and has Supervision of high risk pregnancy, antepartum; Chronic hypertension affecting pregnancy; and Obesity affecting pregnancy on their problem list.  ----------------------------------------------------------------------------------- Patient reports some swelling in her ankles for the past couple of days. She mentions having been standing and walking more in that time. Comfort measures and precautions reviewed.  Informed her that 3 hr gtt is normal. Contractions: Not present. Vag. Bleeding: None.  Movement: Present. Leaking Fluid denies.  ----------------------------------------------------------------------------------- The following portions of the patient's history were reviewed and updated as appropriate: allergies, current medications, past family history, past medical history, past social history, past surgical history and problem list. Problem list updated.  Objective  Blood pressure 125/87, pulse 81, weight 267 lb (121.1 kg), last menstrual period 02/03/2023. Pregravid weight 230 lb (104.3 kg) Total Weight Gain 37 lb (16.8 kg) Urinalysis: Urine Protein    Urine Glucose    Fetal Status: Fetal Heart Rate (bpm): 152 Fundal Height: 32 cm Movement: Present     General:  Alert, oriented and cooperative. Patient is in no acute distress.  Skin: Skin is warm and dry. No rash noted.   Cardiovascular: Normal heart rate noted  Respiratory: Normal respiratory effort, no problems with respiration noted  Abdomen: Soft, gravid, appropriate for gestational age. Pain/Pressure: Absent     Pelvic:  Cervical exam deferred        Extremities: Normal range of motion.     Mental Status: Normal mood and affect. Normal behavior. Normal judgment and thought content.   Assessment   38 y.o.  G3P1101 at [redacted]w[redacted]d by  11/10/2023, by Last Menstrual Period presenting for routine prenatal visit  Plan   third Problems (from 04/01/23 to present)     Problem Noted Resolved   Supervision of high risk pregnancy, antepartum 04/01/2023 by Loran Senters, CMA No   Overview Addendum 08/12/2023 10:19 AM by Mirna Mires, CNM     Clinical Staff Provider  Office Location  [facility] Dating  L and 10wkUS  Language  English Anatomy US  Incomplete views   Flu Vaccine  offer Genetic Screen  NIPS:   TDaP vaccine   offer Hgb A1C or  GTT Early : Third trimester : 1h 151, 3h normal  Covid No boosters   LAB RESULTS   Rhogam  O/Positive/-- (04/22 0955)  Blood Type O/Positive/-- (04/22 0955)   Feeding Plan formal Antibody Negative (04/22 0955)  Contraception pill Rubella 1.15 (04/22 0955)  Circumcision yes RPR Non Reactive (04/22 0955)   Pediatrician  [facility]Ag Negative (04/22 0955)   Support Person Antonio and 2 friends HIV Non Reactive (04/22 0955)  Prenatal Classes no Varicella Immune    GBS  (For PCN allergy, check sensitivities)   BTL Consent  Hep C Non Reactive (04/22 0955)   VBAC Consent  Pap Diagnosis  Date Value Ref Range Status  04/29/2023   Final   - Negative for intraepithelial lesion or malignancy (NILM)      Hgb Electro      CF      SMA                    Preterm labor symptoms and general obstetric precautions including but not limited to vaginal bleeding, contractions, leaking of fluid and fetal movement were reviewed in detail  with the patient. Please refer to After Visit Summary for other counseling recommendations.  Anesthesia consult ordered Go to MFM f/u as scheduled   Return in about 2 weeks (around 09/09/2023) for rob.  Tresea Mall, CNM 08/26/2023 10:12 AM

## 2023-08-27 ENCOUNTER — Other Ambulatory Visit: Payer: Self-pay

## 2023-08-27 DIAGNOSIS — O09523 Supervision of elderly multigravida, third trimester: Secondary | ICD-10-CM

## 2023-08-27 DIAGNOSIS — O99210 Obesity complicating pregnancy, unspecified trimester: Secondary | ICD-10-CM

## 2023-08-27 DIAGNOSIS — O0990 Supervision of high risk pregnancy, unspecified, unspecified trimester: Secondary | ICD-10-CM

## 2023-08-27 DIAGNOSIS — O10919 Unspecified pre-existing hypertension complicating pregnancy, unspecified trimester: Secondary | ICD-10-CM

## 2023-08-28 ENCOUNTER — Ambulatory Visit: Payer: BC Managed Care – PPO | Attending: Obstetrics

## 2023-08-28 ENCOUNTER — Other Ambulatory Visit: Payer: Self-pay

## 2023-08-28 DIAGNOSIS — O99210 Obesity complicating pregnancy, unspecified trimester: Secondary | ICD-10-CM | POA: Diagnosis present

## 2023-08-28 DIAGNOSIS — O09213 Supervision of pregnancy with history of pre-term labor, third trimester: Secondary | ICD-10-CM | POA: Insufficient documentation

## 2023-08-28 DIAGNOSIS — O10013 Pre-existing essential hypertension complicating pregnancy, third trimester: Secondary | ICD-10-CM | POA: Diagnosis not present

## 2023-08-28 DIAGNOSIS — E669 Obesity, unspecified: Secondary | ICD-10-CM | POA: Diagnosis not present

## 2023-08-28 DIAGNOSIS — O10919 Unspecified pre-existing hypertension complicating pregnancy, unspecified trimester: Secondary | ICD-10-CM | POA: Diagnosis not present

## 2023-08-28 DIAGNOSIS — O09523 Supervision of elderly multigravida, third trimester: Secondary | ICD-10-CM | POA: Insufficient documentation

## 2023-08-28 DIAGNOSIS — Z3A29 29 weeks gestation of pregnancy: Secondary | ICD-10-CM | POA: Diagnosis not present

## 2023-08-28 DIAGNOSIS — D259 Leiomyoma of uterus, unspecified: Secondary | ICD-10-CM | POA: Diagnosis not present

## 2023-08-28 DIAGNOSIS — O3412 Maternal care for benign tumor of corpus uteri, second trimester: Secondary | ICD-10-CM | POA: Diagnosis not present

## 2023-08-28 DIAGNOSIS — O0990 Supervision of high risk pregnancy, unspecified, unspecified trimester: Secondary | ICD-10-CM | POA: Insufficient documentation

## 2023-08-28 DIAGNOSIS — O99213 Obesity complicating pregnancy, third trimester: Secondary | ICD-10-CM | POA: Insufficient documentation

## 2023-08-29 ENCOUNTER — Other Ambulatory Visit: Payer: BC Managed Care – PPO

## 2023-09-08 ENCOUNTER — Encounter: Payer: Self-pay | Admitting: Licensed Practical Nurse

## 2023-09-08 ENCOUNTER — Other Ambulatory Visit: Payer: Self-pay | Admitting: Licensed Practical Nurse

## 2023-09-08 DIAGNOSIS — Z6841 Body Mass Index (BMI) 40.0 and over, adult: Secondary | ICD-10-CM

## 2023-09-08 DIAGNOSIS — O0990 Supervision of high risk pregnancy, unspecified, unspecified trimester: Secondary | ICD-10-CM

## 2023-09-08 NOTE — Progress Notes (Signed)
BMI 48.93, referral to anesthesia consult placed. Carie Caddy, CNM   [facility]  09/08/23  8:49 AM

## 2023-09-09 ENCOUNTER — Ambulatory Visit (INDEPENDENT_AMBULATORY_CARE_PROVIDER_SITE_OTHER): Payer: BC Managed Care – PPO | Admitting: Obstetrics & Gynecology

## 2023-09-09 ENCOUNTER — Ambulatory Visit (INDEPENDENT_AMBULATORY_CARE_PROVIDER_SITE_OTHER): Payer: BC Managed Care – PPO

## 2023-09-09 VITALS — BP 115/75 | HR 71 | Ht 62.0 in | Wt 270.0 lb

## 2023-09-09 VITALS — BP 115/75 | HR 71 | Wt 270.0 lb

## 2023-09-09 DIAGNOSIS — O09299 Supervision of pregnancy with other poor reproductive or obstetric history, unspecified trimester: Secondary | ICD-10-CM | POA: Insufficient documentation

## 2023-09-09 DIAGNOSIS — R8271 Bacteriuria: Secondary | ICD-10-CM | POA: Insufficient documentation

## 2023-09-09 DIAGNOSIS — Z3A31 31 weeks gestation of pregnancy: Secondary | ICD-10-CM | POA: Diagnosis not present

## 2023-09-09 DIAGNOSIS — D259 Leiomyoma of uterus, unspecified: Secondary | ICD-10-CM | POA: Insufficient documentation

## 2023-09-09 DIAGNOSIS — O10013 Pre-existing essential hypertension complicating pregnancy, third trimester: Secondary | ICD-10-CM

## 2023-09-09 DIAGNOSIS — O09523 Supervision of elderly multigravida, third trimester: Secondary | ICD-10-CM | POA: Diagnosis not present

## 2023-09-09 DIAGNOSIS — O10919 Unspecified pre-existing hypertension complicating pregnancy, unspecified trimester: Secondary | ICD-10-CM

## 2023-09-09 DIAGNOSIS — E669 Obesity, unspecified: Secondary | ICD-10-CM

## 2023-09-09 DIAGNOSIS — O3410 Maternal care for benign tumor of corpus uteri, unspecified trimester: Secondary | ICD-10-CM

## 2023-09-09 DIAGNOSIS — O09529 Supervision of elderly multigravida, unspecified trimester: Secondary | ICD-10-CM

## 2023-09-09 DIAGNOSIS — O0990 Supervision of high risk pregnancy, unspecified, unspecified trimester: Secondary | ICD-10-CM

## 2023-09-09 DIAGNOSIS — O99210 Obesity complicating pregnancy, unspecified trimester: Secondary | ICD-10-CM

## 2023-09-09 DIAGNOSIS — O99213 Obesity complicating pregnancy, third trimester: Secondary | ICD-10-CM | POA: Diagnosis not present

## 2023-09-09 DIAGNOSIS — O10913 Unspecified pre-existing hypertension complicating pregnancy, third trimester: Secondary | ICD-10-CM

## 2023-09-09 DIAGNOSIS — Z3A32 32 weeks gestation of pregnancy: Secondary | ICD-10-CM | POA: Insufficient documentation

## 2023-09-09 NOTE — Progress Notes (Signed)
    NURSE VISIT NOTE  Subjective:    Patient ID: Alisha Morales, female    DOB: [birth year], 38 y.o.   MRN: [id number]  HPI  Patient is a 38 y.o. G57P1101 female who presents for fetal monitoring per order from Nicholaus Bloom, MD.   Objective:    BP 115/75   Pulse 71   Ht 5\' 2"  (1.575 m)   Wt 270 lb (122.5 kg)   LMP 02/03/2023 (Exact Date)   BMI 49.38 kg/m  Estimated Date of Delivery: 11/10/23  [redacted]w[redacted]d  Fetus A Non-Stress Test Interpretation for 09/09/23  Indication: Advanced Maternal Age >39 years, Chronic Hypertenstion, and Obesity  Fetal Heart Rate A Mode: External Baseline Rate (A): 150 bpm Variability: Moderate Accelerations: 10 x 10 Decelerations: None Multiple birth?: No  Uterine Activity Mode: Toco Contraction Frequency (min): None  Interpretation (Fetal Testing) Nonstress Test Interpretation: Reactive Overall Impression: Reassuring for gestational age   Assessment:   1. AMA (advanced maternal age) multigravida 35+, third trimester   2. Maternal chronic hypertension, third trimester   3. Obesity affecting pregnancy in third trimester, unspecified obesity type   4. [redacted] weeks gestation of pregnancy      Plan:   Results reviewed and discussed with patient by  Nicholaus Bloom, MD.     Rocco Serene, LPN

## 2023-09-09 NOTE — Progress Notes (Signed)
   PRENATAL VISIT NOTE  Subjective:  AQSA VIK is a 38 y.o. G3P1101 at [redacted]w[redacted]d being seen today for ongoing prenatal care.  She is currently monitored for the following issues for this high-risk pregnancy and has Supervision of high risk pregnancy, antepartum; Chronic hypertension affecting pregnancy; Obesity affecting pregnancy; GBS bacteriuria; Antepartum multigravida of advanced maternal age; Pregnancy with poor obstetric history; and Uterine fibroid in pregnancy on their problem list.  Patient reports no complaints.  Contractions: Not present. Vag. Bleeding: None.  Movement: Present. Denies leaking of fluid. She denies headaches, visual changes, RUQ or epigastric pain.  The following portions of the patient's history were reviewed and updated as appropriate: allergies, current medications, past family history, past medical history, past social history, past surgical history and problem list.   Objective:   Vitals:   09/09/23 0954 09/09/23 1014  BP: (!) 135/93 115/75  Pulse: 85 71  Weight: 270 lb (122.5 kg)     Fetal Status:     Movement: Present     General:  Alert, oriented and cooperative. Patient is in no acute distress.  Skin: Skin is warm and dry. No rash noted.   Cardiovascular: Normal heart rate noted  Respiratory: Normal respiratory effort, no problems with respiration noted  Abdomen: Soft, gravid, appropriate for gestational age.  Pain/Pressure: Absent     Pelvic: Cervical exam deferred        Extremities: Normal range of motion.     Mental Status: Normal mood and affect. Normal behavior. Normal judgment and thought content.   DTRs- 1+  NST- reassuring, very active fetus with audible movements  Assessment and Plan:  Pregnancy: G3P1101 at [redacted]w[redacted]d 1. Chronic hypertension affecting pregnancy - I have orderered a baseline PRC, CMP as well as a CBC today since her BP was elevated - NST today and weekly going forward - Her initial BP today was 135/93. A repeat was  115/75 - I have reviewed pre eclampsia pecautions with her.  2. Supervision of high risk pregnancy, antepartum - MFM rec'd BPP with them 3 weeks from her last MFM ultrasound. This was not scheduled, so I have ordered it.  3. Obesity affecting pregnancy, antepartum, unspecified obesity type - check TSH  4. GBS bacteriuria - treat in labor  5. Antepartum multigravida of advanced maternal age - low risk Mat 21  6. [redacted] weeks gestation of pregnancy   7. Pregnancy with poor obstetric history   8. Uterine fibroid in pregnancy   Preterm labor symptoms and general obstetric precautions including but not limited to vaginal bleeding, contractions, leaking of fluid and fetal movement were reviewed in detail with the patient. Please refer to After Visit Summary for other counseling recommendations.   Return today (on 09/09/2023) for NEEDS doctors for her visits.  Future Appointments  Date Time Provider Department Center  09/16/2023  9:00 AM [facility]  09/23/2023 10:00 AM [facility] None  09/23/2023 11:00 AM [facility]    Allie Bossier, MD

## 2023-09-10 ENCOUNTER — Encounter: Payer: Self-pay | Admitting: Obstetrics & Gynecology

## 2023-09-10 LAB — COMPREHENSIVE METABOLIC PANEL
ALT: 15 IU/L (ref 0–32)
AST: 12 IU/L (ref 0–40)
Albumin: 3.2 g/dL — ABNORMAL LOW (ref 3.9–4.9)
Alkaline Phosphatase: 79 IU/L (ref 44–121)
BUN/Creatinine Ratio: 11 (ref 9–23)
BUN: 6 mg/dL (ref 6–20)
Bilirubin Total: <0.2 mg/dL (ref 0.0–1.2)
CO2: 20 mmol/L (ref 20–29)
Calcium: 9.1 mg/dL (ref 8.7–10.2)
Chloride: 102 mmol/L (ref 96–106)
Creatinine, Ser: 0.55 mg/dL — ABNORMAL LOW (ref 0.57–1.00)
Globulin, Total: 3 g/dL (ref 1.5–4.5)
Glucose: 93 mg/dL (ref 70–99)
Potassium: 4.2 mmol/L (ref 3.5–5.2)
Sodium: 136 mmol/L (ref 134–144)
Total Protein: 6.2 g/dL (ref 6.0–8.5)
eGFR: 119 mL/min/{1.73_m2} (ref >59)

## 2023-09-10 LAB — PROTEIN / CREATININE RATIO, URINE
Creatinine, Urine: 50.6 mg/dL
Protein, Ur: 5.7 mg/dL
Protein/Creat Ratio: 113 mg/g{creat} (ref 0–200)

## 2023-09-10 LAB — CBC
Hematocrit: 34.2 % (ref 34.0–46.6)
Hemoglobin: 11.1 g/dL (ref 11.1–15.9)
MCH: 29.4 pg (ref 26.6–33.0)
MCHC: 32.5 g/dL (ref 31.5–35.7)
MCV: 91 fL (ref 79–97)
Platelets: 292 10*3/uL (ref 150–450)
RBC: 3.77 x10E6/uL (ref 3.77–5.28)
RDW: 13.2 % (ref 11.7–15.4)
WBC: 7.4 10*3/uL (ref 3.4–10.8)

## 2023-09-10 LAB — TSH: TSH: 0.997 u[IU]/mL (ref 0.450–4.500)

## 2023-09-10 NOTE — Addendum Note (Signed)
Addended by: Kathlene Cote on: 09/10/2023 09:55 AM   Modules accepted: Orders

## 2023-09-11 ENCOUNTER — Other Ambulatory Visit: Payer: Self-pay

## 2023-09-11 DIAGNOSIS — O10919 Unspecified pre-existing hypertension complicating pregnancy, unspecified trimester: Secondary | ICD-10-CM

## 2023-09-11 DIAGNOSIS — O09523 Supervision of elderly multigravida, third trimester: Secondary | ICD-10-CM

## 2023-09-11 DIAGNOSIS — O99210 Obesity complicating pregnancy, unspecified trimester: Secondary | ICD-10-CM

## 2023-09-11 DIAGNOSIS — O0990 Supervision of high risk pregnancy, unspecified, unspecified trimester: Secondary | ICD-10-CM

## 2023-09-11 DIAGNOSIS — D259 Leiomyoma of uterus, unspecified: Secondary | ICD-10-CM

## 2023-09-16 ENCOUNTER — Ambulatory Visit: Payer: BC Managed Care – PPO | Attending: Maternal & Fetal Medicine

## 2023-09-16 ENCOUNTER — Ambulatory Visit (INDEPENDENT_AMBULATORY_CARE_PROVIDER_SITE_OTHER): Payer: BC Managed Care – PPO

## 2023-09-16 ENCOUNTER — Other Ambulatory Visit: Payer: Self-pay

## 2023-09-16 ENCOUNTER — Ambulatory Visit: Payer: BC Managed Care – PPO | Admitting: Maternal & Fetal Medicine

## 2023-09-16 ENCOUNTER — Ambulatory Visit (INDEPENDENT_AMBULATORY_CARE_PROVIDER_SITE_OTHER): Payer: BC Managed Care – PPO | Admitting: Obstetrics & Gynecology

## 2023-09-16 ENCOUNTER — Other Ambulatory Visit: Payer: Self-pay | Admitting: Obstetrics & Gynecology

## 2023-09-16 VITALS — BP 126/86 | HR 73 | Wt 272.0 lb

## 2023-09-16 VITALS — BP 126/86 | HR 73 | Ht 62.0 in | Wt 272.0 lb

## 2023-09-16 DIAGNOSIS — O10913 Unspecified pre-existing hypertension complicating pregnancy, third trimester: Secondary | ICD-10-CM

## 2023-09-16 DIAGNOSIS — Z3A32 32 weeks gestation of pregnancy: Secondary | ICD-10-CM

## 2023-09-16 DIAGNOSIS — O3413 Maternal care for benign tumor of corpus uteri, third trimester: Secondary | ICD-10-CM

## 2023-09-16 DIAGNOSIS — O09213 Supervision of pregnancy with history of pre-term labor, third trimester: Secondary | ICD-10-CM | POA: Diagnosis not present

## 2023-09-16 DIAGNOSIS — Z79899 Other long term (current) drug therapy: Secondary | ICD-10-CM | POA: Insufficient documentation

## 2023-09-16 DIAGNOSIS — D259 Leiomyoma of uterus, unspecified: Secondary | ICD-10-CM | POA: Insufficient documentation

## 2023-09-16 DIAGNOSIS — O0990 Supervision of high risk pregnancy, unspecified, unspecified trimester: Secondary | ICD-10-CM | POA: Diagnosis present

## 2023-09-16 DIAGNOSIS — O99210 Obesity complicating pregnancy, unspecified trimester: Secondary | ICD-10-CM

## 2023-09-16 DIAGNOSIS — O09523 Supervision of elderly multigravida, third trimester: Secondary | ICD-10-CM | POA: Insufficient documentation

## 2023-09-16 DIAGNOSIS — O3410 Maternal care for benign tumor of corpus uteri, unspecified trimester: Secondary | ICD-10-CM | POA: Diagnosis not present

## 2023-09-16 DIAGNOSIS — O0993 Supervision of high risk pregnancy, unspecified, third trimester: Secondary | ICD-10-CM | POA: Diagnosis not present

## 2023-09-16 DIAGNOSIS — R8271 Bacteriuria: Secondary | ICD-10-CM

## 2023-09-16 DIAGNOSIS — E669 Obesity, unspecified: Secondary | ICD-10-CM | POA: Diagnosis not present

## 2023-09-16 DIAGNOSIS — O10919 Unspecified pre-existing hypertension complicating pregnancy, unspecified trimester: Secondary | ICD-10-CM

## 2023-09-16 DIAGNOSIS — O99213 Obesity complicating pregnancy, third trimester: Secondary | ICD-10-CM | POA: Insufficient documentation

## 2023-09-16 DIAGNOSIS — O10013 Pre-existing essential hypertension complicating pregnancy, third trimester: Secondary | ICD-10-CM | POA: Diagnosis not present

## 2023-09-16 LAB — POCT URINALYSIS DIPSTICK OB
Bilirubin, UA: NEGATIVE
Blood, UA: NEGATIVE
Glucose, UA: NEGATIVE
Ketones, UA: NEGATIVE
Leukocytes, UA: NEGATIVE
Nitrite, UA: NEGATIVE
POC,PROTEIN,UA: NEGATIVE
Spec Grav, UA: 1.01 (ref 1.010–1.025)
Urobilinogen, UA: 0.2 U/dL
pH, UA: 6 (ref 5.0–8.0)

## 2023-09-16 MED ORDER — NIFEDIPINE ER OSMOTIC RELEASE 30 MG PO TB24: 60.0000 mg | ORAL_TABLET | Freq: Every day | ORAL | 12 refills | Status: DC

## 2023-09-16 NOTE — Patient Instructions (Signed)

## 2023-09-16 NOTE — Progress Notes (Signed)
   PRENATAL VISIT NOTE  Subjective:  Alisha Morales is a 38 y.o. G3P1101 at [redacted]w[redacted]d being seen today for ongoing prenatal care.  She is currently monitored for the following issues for this high-risk pregnancy and has Supervision of high risk pregnancy, antepartum; Maternal chronic hypertension, third trimester; Obesity affecting pregnancy; GBS bacteriuria; AMA (advanced maternal age) multigravida 35+, third trimester; Pregnancy with poor obstetric history; Uterine fibroid in pregnancy; and [redacted] weeks gestation of pregnancy on their problem list.  Patient reports no complaints.  Contractions: Not present. Vag. Bleeding: None.  Movement: Present. Denies leaking of fluid.   The following portions of the patient's history were reviewed and updated as appropriate: allergies, current medications, past family history, past medical history, past social history, past surgical history and problem list.   Objective:   Vitals:   09/16/23 1100 09/16/23 1111  BP: (!) 140/87 126/86  Pulse: 68 73  Weight: 272 lb (123.4 kg)     Fetal Status:     Movement: Present     General:  Alert, oriented and cooperative. Patient is in no acute distress.  Skin: Skin is warm and dry. No rash noted.   Cardiovascular: Normal heart rate noted  Respiratory: Normal respiratory effort, no problems with respiration noted  Abdomen: Soft, gravid, appropriate for gestational age.  Pain/Pressure: Absent     Pelvic: Cervical exam deferred        Extremities: Normal range of motion.     Mental Status: Normal mood and affect. Normal behavior. Normal judgment and thought content.  BPP 8/8 at MFM today NST reactive here today  Assessment and Plan:  Pregnancy: G3P1101 at [redacted]w[redacted]d 1. Supervision of high risk pregnancy, antepartum  - POC Urinalysis Dipstick OB  2. Obesity affecting pregnancy, antepartum, unspecified obesity type - normal 1 hour glucola  3. GBS bacteriuria - treat in labor  4. AMA (advanced maternal age)  multigravida 35+, third trimester - low risk NIPS  5. [redacted] weeks gestation of pregnancy  6. HTN in pregnancy- MFM increased her procardia from 30 to 60 mg today - weekly BPP with MFM - pre eclampsia symptoms reviewed   Preterm labor symptoms and general obstetric precautions including but not limited to vaginal bleeding, contractions, leaking of fluid and fetal movement were reviewed in detail with the patient. Please refer to After Visit Summary for other counseling recommendations.   Return for every week.  Future Appointments  Date Time Provider Department Center  09/23/2023 10:00 AM [facility] None  09/23/2023 11:00 AM [facility]  09/30/2023  9:30 AM [facility] NST [facility] None  10/07/2023  2:30 PM [facility]  10/14/2023  9:30 AM [facility] NST [facility] None    Allie Bossier, MD

## 2023-09-16 NOTE — Progress Notes (Signed)
   Patient information  Patient Name: Alisha Morales  Patient MRN:   [id number]  Referring practice: MFM Referring Provider: Salomon Mast  MFM CONSULT  [facility] is a 38 y.o. G3P1101 at [redacted]w[redacted]d here for ultrasound and consultation.   The patient is here for her BPP today which is an 8 out of 8.  She denies any signs or symptoms of preeclampsia such as headaches, blurry vision, visual disturbances, right upper quadrant pain or excessive swelling.  I discussed that her blood pressure is above the goal of 140/90 but is not severe.  Her highest blood pressure today in the clinic was 148/88.  I discussed that she would need to have preeclampsia labs today after her OB visit as well as increase her Procardia to 60 mg daily.  I discussed the importance of monitoring for signs and symptoms of preeclampsia as well as continued antenatal testing and potential for early delivery.  Sonographic findings Single intrauterine pregnancy. Fetal cardiac activity: Observed. Presentation: Cephalic. Interval fetal anatomy appears normal. Amniotic fluid volume: Within normal limits. AFI: 8.8 cm.  MVP: 3.62 cm. Placenta: Anterior. BPP: 8/8.   Assessment Maternal chronic hypertension, third trimester  [redacted] weeks gestation of pregnancy  Plan -CBC, CMP and UPC to be collected at today's prenatal visit. OB provider messaged.  -Continue weekly antenatal testing. -Preeclampsia precautions discussed -Encouraged at home blood pressure monitoring -Increase blood pressure medicine to 60 mg daily of Procardia -Blood pressure goal of less than 140/90 -Delivery timing will be based on clinical course but likely around 37 weeks to [redacted] weeks gestation.  Review of Systems: A review of systems was performed and was negative except per HPI   Vitals and Physical Exam    09/16/2023    9:38 AM 09/16/2023    9:00 AM 09/09/2023   10:51 AM  Vitals with BMI  Height  5\' 3"  5\' 2"   Weight  271 lbs 8 oz 270 lbs  BMI  48.11  49.37  Systolic 148 140 710  Diastolic 88 90 75  Pulse  78 71  Sitting comfortably on the sonogram table Nonlabored breathing Normal rate and rhythm Abdomen is nontender  Past pregnancies OB History  Gravida Para Term Preterm AB Living  3 2 1 1   1   SAB IAB Ectopic Multiple Live Births          1    # Outcome Date GA Lbr Len/2nd Weight Sex Type Anes PTL Lv  3 Current           2 Term 08/08/09 [redacted]w[redacted]d  3827 g M Vag-Spont  N LIV  1 Preterm 2005 [redacted]w[redacted]d    Vag-Spont   FD     Birth Comments: TWINS   I spent 20 minutes reviewing the patients chart, including labs and images as well as counseling the patient about her medical conditions. Greater than 50% of the time was spent in direct face-to-face patient counseling.  Braxton Feathers  MFM, [facility]   09/16/2023  10:16 AM

## 2023-09-16 NOTE — Progress Notes (Addendum)
NURSE VISIT NOTE  Subjective:    Patient ID: Alisha Morales, female    DOB: [birth year], 38 y.o.   MRN: [id number]  HPI  Patient is a 38 y.o. G68P1101 female who presents for fetal monitoring per order from Nicholaus Bloom, MD.   Objective:    BP 126/86   Pulse 73   Ht 5\' 2"  (1.575 m)   Wt 272 lb (123.4 kg)   LMP 02/03/2023 (Exact Date)   BMI 49.75 kg/m  Estimated Date of Delivery: 11/10/23  [redacted]w[redacted]d  Fetus A Non-Stress Test Interpretation for 09/16/23  Indication: Advanced Maternal Age >40 years, Chronic Hypertenstion, and Obesity  Fetal Heart Rate A Mode: External Baseline Rate (A): 150 bpm Variability: Moderate Accelerations: 15 x 15 Decelerations: None Multiple birth?: No  Uterine Activity Mode: Toco Contraction Frequency (min): None  Interpretation (Fetal Testing) Nonstress Test Interpretation: Reactive Overall Impression: Reassuring for gestational age   Assessment:   1. AMA (advanced maternal age) multigravida 35+, third trimester   2. Maternal chronic hypertension, third trimester   3. Obesity affecting pregnancy, antepartum, unspecified obesity type      Plan:   Results reviewed and discussed with patient by  Nicholaus Bloom, MD.     Rocco Serene, LPN

## 2023-09-16 NOTE — Progress Notes (Signed)
Labs placed per Dr. Sherrell Puller request

## 2023-09-17 ENCOUNTER — Encounter: Payer: Self-pay | Admitting: Obstetrics & Gynecology

## 2023-09-17 LAB — PROTEIN / CREATININE RATIO, URINE
Creatinine, Urine: 20.4 mg/dL
Protein, Ur: 4.8 mg/dL
Protein/Creat Ratio: 235 mg/g{creat} — ABNORMAL HIGH (ref 0–200)

## 2023-09-17 LAB — COMPREHENSIVE METABOLIC PANEL
ALT: 13 IU/L (ref 0–32)
AST: 15 IU/L (ref 0–40)
Albumin: 3.2 g/dL — ABNORMAL LOW (ref 3.9–4.9)
Alkaline Phosphatase: 82 IU/L (ref 44–121)
BUN/Creatinine Ratio: 10 (ref 9–23)
BUN: 5 mg/dL — ABNORMAL LOW (ref 6–20)
Bilirubin Total: <0.2 mg/dL (ref 0.0–1.2)
CO2: 19 mmol/L — ABNORMAL LOW (ref 20–29)
Calcium: 9 mg/dL (ref 8.7–10.2)
Chloride: 104 mmol/L (ref 96–106)
Creatinine, Ser: 0.52 mg/dL — ABNORMAL LOW (ref 0.57–1.00)
Globulin, Total: 2.9 g/dL (ref 1.5–4.5)
Glucose: 87 mg/dL (ref 70–99)
Potassium: 4.2 mmol/L (ref 3.5–5.2)
Sodium: 139 mmol/L (ref 134–144)
Total Protein: 6.1 g/dL (ref 6.0–8.5)
eGFR: 121 mL/min/{1.73_m2} (ref >59)

## 2023-09-17 LAB — CBC
Hematocrit: 33.2 % — ABNORMAL LOW (ref 34.0–46.6)
Hemoglobin: 11 g/dL — ABNORMAL LOW (ref 11.1–15.9)
MCH: 29.2 pg (ref 26.6–33.0)
MCHC: 33.1 g/dL (ref 31.5–35.7)
MCV: 88 fL (ref 79–97)
Platelets: 274 10*3/uL (ref 150–450)
RBC: 3.77 x10E6/uL (ref 3.77–5.28)
RDW: 12.4 % (ref 11.7–15.4)
WBC: 7.4 10*3/uL (ref 3.4–10.8)

## 2023-09-18 ENCOUNTER — Other Ambulatory Visit: Payer: Self-pay

## 2023-09-18 DIAGNOSIS — O99210 Obesity complicating pregnancy, unspecified trimester: Secondary | ICD-10-CM

## 2023-09-18 DIAGNOSIS — O10913 Unspecified pre-existing hypertension complicating pregnancy, third trimester: Secondary | ICD-10-CM

## 2023-09-18 DIAGNOSIS — O09523 Supervision of elderly multigravida, third trimester: Secondary | ICD-10-CM

## 2023-09-18 DIAGNOSIS — D259 Leiomyoma of uterus, unspecified: Secondary | ICD-10-CM

## 2023-09-23 ENCOUNTER — Ambulatory Visit: Payer: BC Managed Care – PPO | Attending: Maternal & Fetal Medicine

## 2023-09-23 ENCOUNTER — Ambulatory Visit (INDEPENDENT_AMBULATORY_CARE_PROVIDER_SITE_OTHER): Payer: BC Managed Care – PPO | Admitting: Obstetrics

## 2023-09-23 ENCOUNTER — Encounter
Admission: RE | Admit: 2023-09-23 | Discharge: 2023-09-23 | Disposition: A | Discharge status: Home / Self Care | Payer: BC Managed Care – PPO | Source: Ambulatory Visit | Attending: Anesthesiology | Admitting: Anesthesiology

## 2023-09-23 ENCOUNTER — Other Ambulatory Visit: Payer: Self-pay

## 2023-09-23 VITALS — BP 141/87 | HR 83 | Wt 273.2 lb

## 2023-09-23 VITALS — BP 139/98 | HR 83 | Temp 98.5°F | Ht 62.0 in | Wt 270.0 lb

## 2023-09-23 DIAGNOSIS — O3413 Maternal care for benign tumor of corpus uteri, third trimester: Secondary | ICD-10-CM

## 2023-09-23 DIAGNOSIS — D259 Leiomyoma of uterus, unspecified: Secondary | ICD-10-CM | POA: Diagnosis not present

## 2023-09-23 DIAGNOSIS — O09213 Supervision of pregnancy with history of pre-term labor, third trimester: Secondary | ICD-10-CM

## 2023-09-23 DIAGNOSIS — O10013 Pre-existing essential hypertension complicating pregnancy, third trimester: Secondary | ICD-10-CM

## 2023-09-23 DIAGNOSIS — O99210 Obesity complicating pregnancy, unspecified trimester: Secondary | ICD-10-CM | POA: Diagnosis present

## 2023-09-23 DIAGNOSIS — O09523 Supervision of elderly multigravida, third trimester: Secondary | ICD-10-CM

## 2023-09-23 DIAGNOSIS — E669 Obesity, unspecified: Secondary | ICD-10-CM | POA: Diagnosis not present

## 2023-09-23 DIAGNOSIS — O0990 Supervision of high risk pregnancy, unspecified, unspecified trimester: Secondary | ICD-10-CM

## 2023-09-23 DIAGNOSIS — O09293 Supervision of pregnancy with other poor reproductive or obstetric history, third trimester: Secondary | ICD-10-CM | POA: Insufficient documentation

## 2023-09-23 DIAGNOSIS — O99213 Obesity complicating pregnancy, third trimester: Secondary | ICD-10-CM | POA: Diagnosis not present

## 2023-09-23 DIAGNOSIS — O10913 Unspecified pre-existing hypertension complicating pregnancy, third trimester: Secondary | ICD-10-CM

## 2023-09-23 DIAGNOSIS — Z3A33 33 weeks gestation of pregnancy: Secondary | ICD-10-CM

## 2023-09-23 NOTE — Consult Note (Signed)
[facility] Anesthesia Consultation  Alisha Morales ZOX:[id number] DOB: [birth year] DOA: 09/23/2023 PCP: Nira Retort   Requesting physician: Braxton Feathers, DO Date of consultation: 09/23/23 Reason for consultation: Obesity during pregnancy  CHIEF COMPLAINT:  Obesity during pregnancy  HISTORY OF PRESENT ILLNESS: Alisha Morales  is a 38 y.o. female with a known history of HTN and obesity, presenting for anesthetic evaluation prior to delivery. She has one child from a previous vaginal delivery with epidural 14 years ago. She plans on epidural this time. No blood thinner use. No problems with anesthesia before.  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    PAST SURGICAL HISTORY:  Past Surgical History:  Procedure Laterality Date   WISDOM TOOTH EXTRACTION     four; age 10    SOCIAL HISTORY:  Social History   Tobacco Use   Smoking status: Never   Smokeless tobacco: Never  Substance Use Topics   Alcohol use: No    FAMILY HISTORY:  Family History  Problem Relation Age of Onset   Stroke Mother    Diabetes Father    Hypercalcemia Father    Hypertension Father    Healthy Sister    Healthy Brother    Healthy Brother    Healthy Brother    Healthy Brother    Healthy Brother    Diabetes Maternal Grandmother    Hypertension Maternal Grandmother    Hypertension Maternal Grandfather    Diabetes Maternal Grandfather    Healthy Paternal Grandmother    Healthy Paternal Grandfather     DRUG ALLERGIES: No Known Allergies  REVIEW OF SYSTEMS:   RESPIRATORY: No cough, shortness of breath, wheezing.  CARDIOVASCULAR: No chest pain, orthopnea, edema.  HEMATOLOGY: No anemia, easy bruising or bleeding SKIN: No rash or lesion. NEUROLOGIC: No tingling, numbness, weakness.  PSYCHIATRY: No anxiety or depression.   MEDICATIONS AT HOME:  Prior to Admission medications   Medication Sig Start  Date End Date Taking? Authorizing Provider  aspirin EC 81 MG tablet Take 1 tablet (81 mg total) by mouth daily. Swallow whole. 04/29/23   Doreene Burke, CNM  NIFEdipine (PROCARDIA-XL/NIFEDICAL-XL) 30 MG 24 hr tablet Take 2 tablets (60 mg total) by mouth daily. Can increase to twice a day as needed for symptomatic contractions 09/16/23   Schaible, Burk, DO  Prenatal Vit-Fe Fumarate-FA (MULTIVITAMIN-PRENATAL) 27-0.8 MG TABS tablet Take 1 tablet by mouth daily at 12 noon.    [provider]      PHYSICAL EXAMINATION:   VITAL SIGNS: Last menstrual period 02/03/2023.  GENERAL:  38 y.o.-year-old patient no acute distress.  HEENT: Head atraumatic, normocephalic. Oropharynx and nasopharynx clear. MP 3, TM distance >3 cm, normal mouth opening. LUNGS: No use of accessory muscles of respiration.   EXTREMITIES: No pedal edema, cyanosis, or clubbing.  NEUROLOGIC: normal gait PSYCHIATRIC: The patient is alert and oriented x 3.  SKIN: No obvious rash, lesion, or ulcer.    IMPRESSION AND PLAN:   Alisha Morales  is a 38 y.o. female presenting with obesity during pregnancy. BMI is currently 49.38 at [redacted]w[redacted]d gestation.   We discussed analgesic options during labor including epidural analgesia. Discussed that in obesity there can be increased difficulty with epidural placement or even failure of successful epidural. We also discussed that even after successful epidural placement there is increased risk of catheter migration out of the epidural space that would require catheter replacement. Discussed use of epidural vs spinal vs GA  if cesarean delivery is required. Discussed increased risk of difficult intubation during pregnancy should an emergency cesarean delivery be required.   This patient is appropriate for anesthetic care at [facility] in [place]. We discussed the limitations of a community hospital including but not limited to staffing, imaging, medications, and blood products. The patient is aware  of these limitations. All questions answered and concerns addressed.

## 2023-09-23 NOTE — Progress Notes (Signed)
Routine Prenatal Care Visit  Subjective  Alisha Morales is a 38 y.o. G3P1101 at [redacted]w[redacted]d being seen today for ongoing prenatal care.  She is currently monitored for the following issues for this high-risk pregnancy and has Supervision of high risk pregnancy, antepartum; Maternal chronic hypertension, third trimester; Obesity affecting pregnancy; GBS bacteriuria; AMA (advanced maternal age) multigravida 35+, third trimester; Pregnancy with poor obstetric history; and Uterine fibroid in pregnancy on their problem list.  ----------------------------------------------------------------------------------- Patient reports no complaints.  She has had her BPP today. She denies any headache or blurred vision. She has also met with anesthesia today. Contractions: Not present. Vag. Bleeding: None.  Movement: Present. Leaking Fluid denies.  ----------------------------------------------------------------------------------- The following portions of the patient's history were reviewed and updated as appropriate: allergies, current medications, past family history, past medical history, past social history, past surgical history and problem list. Problem list updated.  Objective  Blood pressure (!) 141/87, pulse 83, weight 273 lb 3.2 oz (123.9 kg), last menstrual period 02/03/2023. Pregravid weight 230 lb (104.3 kg) Total Weight Gain 43 lb 3.2 oz (19.6 kg) Urinalysis: Urine Protein    Urine Glucose    Fetal Status: Fetal Heart Rate (bpm): 142   Movement: Present  Presentation: Vertex  General:  Alert, oriented and cooperative. Patient is in no acute distress.  Skin: Skin is warm and dry. No rash noted.   Cardiovascular: Normal heart rate noted  Respiratory: Normal respiratory effort, no problems with respiration noted  Abdomen: Soft, gravid, appropriate for gestational age. Pain/Pressure: Absent     Pelvic:  Cervical exam deferred        Extremities: Normal range of motion.  Edema: Mild pitting, slight  indentation  Mental Status: Normal mood and affect. Normal behavior. Normal judgment and thought content.   Assessment   38 y.o. G3P1101 at [redacted]w[redacted]d by  11/10/2023, by Last Menstrual Period presenting for routine prenatal visit  Elevated blood pressure today, on Procardia  Plan   third Problems (from 04/01/23 to present)     Problem Noted Resolved   Supervision of high risk pregnancy, antepartum 04/01/2023 by Loran Senters, CMA No   Overview Addendum 08/26/2023 10:18 AM by Tresea Mall, CNM     Clinical Staff Provider  Office Location  [facility] Dating  L and 10wkUS  Language  English Anatomy US  Incomplete views   Flu Vaccine  offer Genetic Screen  NIPS:   TDaP vaccine   offer Hgb A1C or  GTT Early : Third trimester : 1h 151, 3h normal  Covid No boosters   LAB RESULTS   Rhogam  O/Positive/-- (04/22 0955)  Blood Type O/Positive/-- (04/22 0955)   Feeding Plan formal Antibody Negative (04/22 0955)  Contraception pill Rubella 1.15 (04/22 0955)  Circumcision yes RPR Non Reactive (04/22 0955)   Pediatrician  [facility] HBsAg Negative (04/22 0955)   Support Person Antonio and 2 friends HIV Non Reactive (04/22 0955)  Prenatal Classes no Varicella Immune    GBS  (For PCN allergy, check sensitivities)   BTL Consent  Hep C Non Reactive (04/22 0955)   VBAC Consent  Pap Diagnosis  Date Value Ref Range Status  04/29/2023   Final   - Negative for intraepithelial lesion or malignancy (NILM)      Hgb Electro      CF      SMA                    Preterm labor symptoms and general obstetric  precautions including but not limited to vaginal bleeding, contractions, leaking of fluid and fetal movement were reviewed in detail with the patient. Please refer to After Visit Summary for other counseling recommendations.  As she has elevated BPS and is on Procardia 60 XL, will have her head to L and D for serial pressures and get PIH labs plus NST. I have notified the unit. And explained to  the patient the need to monitor her and get labwork again.  Return in about 1 week (around 09/30/2023) for return OB, NST, BP check.  Mirna Mires, CNM  09/23/2023 3:17 PM

## 2023-09-24 ENCOUNTER — Encounter: Payer: Self-pay | Admitting: Obstetrics and Gynecology

## 2023-09-24 ENCOUNTER — Inpatient Hospital Stay
Admission: EM | Admit: 2023-09-24 | Discharge: 2023-09-24 | Disposition: A | Discharge status: Home / Self Care | Payer: BC Managed Care – PPO | Attending: Obstetrics | Admitting: Obstetrics

## 2023-09-24 ENCOUNTER — Other Ambulatory Visit: Payer: Self-pay

## 2023-09-24 DIAGNOSIS — Z3A33 33 weeks gestation of pregnancy: Secondary | ICD-10-CM | POA: Insufficient documentation

## 2023-09-24 DIAGNOSIS — O0990 Supervision of high risk pregnancy, unspecified, unspecified trimester: Secondary | ICD-10-CM

## 2023-09-24 DIAGNOSIS — O10913 Unspecified pre-existing hypertension complicating pregnancy, third trimester: Secondary | ICD-10-CM | POA: Insufficient documentation

## 2023-09-24 DIAGNOSIS — O10013 Pre-existing essential hypertension complicating pregnancy, third trimester: Secondary | ICD-10-CM

## 2023-09-24 DIAGNOSIS — Z79899 Other long term (current) drug therapy: Secondary | ICD-10-CM | POA: Diagnosis not present

## 2023-09-24 LAB — COMPREHENSIVE METABOLIC PANEL
ALT: 18 U/L (ref 0–44)
AST: 18 U/L (ref 15–41)
Albumin: 2.6 g/dL — ABNORMAL LOW (ref 3.5–5.0)
Alkaline Phosphatase: 71 U/L (ref 38–126)
Anion gap: 7 (ref 5–15)
BUN: 10 mg/dL (ref 6–20)
CO2: 25 mmol/L (ref 22–32)
Calcium: 9.2 mg/dL (ref 8.9–10.3)
Chloride: 105 mmol/L (ref 98–111)
Creatinine, Ser: 0.59 mg/dL (ref 0.44–1.00)
GFR, Estimated: >60 mL/min (ref >60)
Glucose, Bld: 96 mg/dL (ref 70–99)
Potassium: 4.5 mmol/L (ref 3.5–5.1)
Sodium: 137 mmol/L (ref 135–145)
Total Bilirubin: 0.2 mg/dL — ABNORMAL LOW (ref 0.3–1.2)
Total Protein: 6.4 g/dL — ABNORMAL LOW (ref 6.5–8.1)

## 2023-09-24 LAB — CBC
HCT: 31.9 % — ABNORMAL LOW (ref 36.0–46.0)
Hemoglobin: 10.8 g/dL — ABNORMAL LOW (ref 12.0–15.0)
MCH: 29.4 pg (ref 26.0–34.0)
MCHC: 33.9 g/dL (ref 30.0–36.0)
MCV: 86.9 fL (ref 80.0–100.0)
Platelets: 265 10*3/uL (ref 150–400)
RBC: 3.67 MIL/uL — ABNORMAL LOW (ref 3.87–5.11)
RDW: 14.2 % (ref 11.5–15.5)
WBC: 7.9 10*3/uL (ref 4.0–10.5)
nRBC: 0 % (ref 0.0–0.2)

## 2023-09-24 LAB — PROTEIN / CREATININE RATIO, URINE
Creatinine, Urine: 128 mg/dL
Protein Creatinine Ratio: 0.13 mg/mg{Cre} (ref 0.00–0.15)
Total Protein, Urine: 16 mg/dL

## 2023-09-24 MED ORDER — ONDANSETRON HCL 4 MG/2ML IJ SOLN: 4.0000 mg | Freq: Four times a day (QID) | INTRAMUSCULAR | Status: DC | PRN

## 2023-09-24 MED ORDER — ACETAMINOPHEN 325 MG PO TABS: 650.0000 mg | ORAL_TABLET | ORAL | Status: DC | PRN

## 2023-09-24 MED ORDER — LACTATED RINGERS IV SOLN: 500.0000 mL | INTRAVENOUS | Status: DC | PRN

## 2023-09-24 MED ORDER — HYDROXYZINE HCL 25 MG PO TABS: 50.0000 mg | ORAL_TABLET | Freq: Four times a day (QID) | ORAL | Status: DC | PRN

## 2023-09-24 MED ORDER — SOD CITRATE-CITRIC ACID 500-334 MG/5ML PO SOLN: 30.0000 mL | ORAL | Status: DC | PRN

## 2023-09-24 NOTE — OB Triage Note (Addendum)
Pt presents to L&D with elevated BP. Pt reports being sent over from the office yesterday but was unable to come until today. Initial BP 143/82, cycling q15 mins. All other VSS. Pt denies epigastric pain, headache and other PreE symptoms. Monitors applied and assessing. Pt denies LOF, bleeding, and endorses positive fetal movement.  Lovis More B. Jaivon Vanbeek, RN BSN 09/24/2023 1:49 PM

## 2023-09-24 NOTE — OB Triage Note (Signed)
LABOR & DELIVERY OB TRIAGE NOTE  SUBJECTIVE  HPI Alisha Morales is a 38 y.o. G3P1101 at [redacted]w[redacted]d who presents to Labor & Delivery for evaluation of elevated blood pressure. She has CHTN and is on Procardia XL 60 mg daily. Per her MFM note on 09/16/23, her goal BP is 140/90. She was seen in the office yesterday and had a BP of 141/87. She was not able to come to triage yesterday. She denies HA, visual changes, and epigastric pain. BPs in triage have been 125-148/75-81.  OB History     Gravida  3   Para  2   Term  1   Preterm  1   AB      Living  1      SAB      IAB      Ectopic      Multiple      Live Births  1           Scheduled Meds: Continuous Infusions:  lactated ringers     PRN Meds:.acetaminophen, hydrOXYzine, lactated ringers, ondansetron, sodium citrate-citric acid  OBJECTIVE  BP (!) 146/79   Pulse 72   Temp 98.7 F (37.1 C) (Oral)   Resp 16   Ht 5' 2.01" (1.575 m)   Wt 122.9 kg   LMP 02/03/2023 (Exact Date)   BMI 49.55 kg/m   General: alert, cooperative, NAD Heart: RRR Lungs: CTAB Abdomen: soft, non-tender, gravid Cervical exam: deferred  Labs CBC    Component Value Date/Time   WBC 7.9 09/24/2023 1432   RBC 3.67 (L) 09/24/2023 1432   HGB 10.8 (L) 09/24/2023 1432   HGB 11.0 (L) 09/16/2023 1200   HCT 31.9 (L) 09/24/2023 1432   HCT 33.2 (L) 09/16/2023 1200   PLT 265 09/24/2023 1432   PLT 274 09/16/2023 1200   MCV 86.9 09/24/2023 1432   MCV 88 09/16/2023 1200   MCH 29.4 09/24/2023 1432   MCHC 33.9 09/24/2023 1432   RDW 14.2 09/24/2023 1432   RDW 12.4 09/16/2023 1200   LYMPHSABS 1.2 08/12/2023 1028   EOSABS 0.1 08/12/2023 1028   BASOSABS 0.0 08/12/2023 1028   CMP     Component Value Date/Time   NA 137 09/24/2023 1432   NA 139 09/16/2023 1200   K 4.5 09/24/2023 1432   CL 105 09/24/2023 1432   CO2 25 09/24/2023 1432   GLUCOSE 96 09/24/2023 1432   BUN 10 09/24/2023 1432   BUN 5 (L) 09/16/2023 1200   CREATININE 0.59  09/24/2023 1432   CALCIUM 9.2 09/24/2023 1432   PROT 6.4 (L) 09/24/2023 1432   PROT 6.1 09/16/2023 1200   ALBUMIN 2.6 (L) 09/24/2023 1432   ALBUMIN 3.2 (L) 09/16/2023 1200   AST 18 09/24/2023 1432   ALT 18 09/24/2023 1432   ALKPHOS 71 09/24/2023 1432   BILITOT 0.2 (L) 09/24/2023 1432   BILITOT <0.2 09/16/2023 1200   EGFR 121 09/16/2023 1200   GFRNONAA >60 09/24/2023 1432   Protein/Creatinine Ratio: 0.13  NST I reviewed the NST and it was reactive. Monitored for approximately 2 hours Baseline: 145 Variability: moderate Accelerations: present Decelerations: isolated variable followed by ~40 minutes of Category 1 tracing with accels Toco: no contractions  ASSESSMENT Impression  1) Pregnancy at G3P1101, [redacted]w[redacted]d, Estimated Date of Delivery: 11/10/23 2) Reassuring maternal/fetal status 3) Chronic hypertension, negative preeclampsia workup  PLAN 1) Discharge home with standard labor/HTN return precautions 2) Continue Procardia XL 60 mg daily. Consider increase if BPs remain above 3) Keep  scheduled weekly ROB and MFM appointments  Plan reviewed with Dr. Logan Bores.  Guadlupe Spanish, CNM 09/24/23  3:58 PM

## 2023-09-30 ENCOUNTER — Ambulatory Visit: Payer: BC Managed Care – PPO | Attending: Maternal & Fetal Medicine | Admitting: Maternal & Fetal Medicine

## 2023-09-30 ENCOUNTER — Other Ambulatory Visit: Payer: Self-pay

## 2023-09-30 ENCOUNTER — Encounter: Payer: Self-pay | Admitting: Obstetrics

## 2023-09-30 ENCOUNTER — Ambulatory Visit (INDEPENDENT_AMBULATORY_CARE_PROVIDER_SITE_OTHER): Payer: BC Managed Care – PPO | Admitting: Obstetrics

## 2023-09-30 ENCOUNTER — Other Ambulatory Visit: Payer: Self-pay | Admitting: Obstetrics

## 2023-09-30 VITALS — BP 126/86 | HR 91 | Temp 98.1°F | Resp 18 | Ht 62.0 in | Wt 271.0 lb

## 2023-09-30 VITALS — BP 132/82 | HR 79 | Wt 272.1 lb

## 2023-09-30 DIAGNOSIS — O10913 Unspecified pre-existing hypertension complicating pregnancy, third trimester: Secondary | ICD-10-CM

## 2023-09-30 DIAGNOSIS — O99213 Obesity complicating pregnancy, third trimester: Secondary | ICD-10-CM

## 2023-09-30 DIAGNOSIS — O0990 Supervision of high risk pregnancy, unspecified, unspecified trimester: Secondary | ICD-10-CM

## 2023-09-30 DIAGNOSIS — Z3A34 34 weeks gestation of pregnancy: Secondary | ICD-10-CM | POA: Diagnosis not present

## 2023-09-30 DIAGNOSIS — D259 Leiomyoma of uterus, unspecified: Secondary | ICD-10-CM

## 2023-09-30 DIAGNOSIS — E669 Obesity, unspecified: Secondary | ICD-10-CM | POA: Diagnosis not present

## 2023-09-30 DIAGNOSIS — O3413 Maternal care for benign tumor of corpus uteri, third trimester: Secondary | ICD-10-CM

## 2023-09-30 DIAGNOSIS — O99210 Obesity complicating pregnancy, unspecified trimester: Secondary | ICD-10-CM

## 2023-09-30 DIAGNOSIS — O09523 Supervision of elderly multigravida, third trimester: Secondary | ICD-10-CM

## 2023-09-30 DIAGNOSIS — O09293 Supervision of pregnancy with other poor reproductive or obstetric history, third trimester: Secondary | ICD-10-CM

## 2023-09-30 DIAGNOSIS — Z113 Encounter for screening for infections with a predominantly sexual mode of transmission: Secondary | ICD-10-CM

## 2023-09-30 DIAGNOSIS — O10013 Pre-existing essential hypertension complicating pregnancy, third trimester: Secondary | ICD-10-CM

## 2023-09-30 NOTE — Progress Notes (Signed)
Routine Prenatal Care Visit  Subjective  Alisha Morales is a 38 y.o. G3P1101 at 110w1d being seen today for ongoing prenatal care.  She is currently monitored for the following issues for this high-risk pregnancy and has Supervision of high risk pregnancy, antepartum; Maternal chronic hypertension, third trimester; Obesity affecting pregnancy; GBS bacteriuria; AMA (advanced maternal age) multigravida 35+, third trimester; Pregnancy with poor obstetric history; Uterine fibroid in pregnancy; and Labor and delivery, indication for care on their problem list.  ----------------------------------------------------------------------------------- Patient reports no complaints.  She continues to see MFM every week. Thinking about when she may start maternity leave. Her baby is active. Contractions: Not present. Vag. Bleeding: None.  Movement: Present. Leaking Fluid denies.  ----------------------------------------------------------------------------------- The following portions of the patient's history were reviewed and updated as appropriate: allergies, current medications, past family history, past medical history, past social history, past surgical history and problem list. Problem list updated.  Objective  Blood pressure 132/82, pulse 79, weight 272 lb 1.6 oz (123.4 kg), last menstrual period 02/03/2023. Pregravid weight 230 lb (104.3 kg) Total Weight Gain 42 lb 1.6 oz (19.1 kg) Urinalysis: Urine Protein    Urine Glucose    Fetal Status:     Movement: Present     General:  Alert, oriented and cooperative. Patient is in no acute distress.  Skin: Skin is warm and dry. No rash noted.   Cardiovascular: Normal heart rate noted  Respiratory: Normal respiratory effort, no problems with respiration noted  Abdomen: Soft, gravid, appropriate for gestational age. Pain/Pressure: Absent     Pelvic:  Cervical exam deferred        Extremities: Normal range of motion.  Edema: Trace  Mental Status: Normal mood  and affect. Normal behavior. Normal judgment and thought content.   Assessment   38 y.o. G3P1101 at [redacted]w[redacted]d by  11/10/2023, by Last Menstrual Period presenting for routine prenatal visit  Plan   third Problems (from 04/01/23 to present)     Problem Noted Resolved   Supervision of high risk pregnancy, antepartum 04/01/2023 by Loran Senters, CMA No   Overview Addendum 09/30/2023 11:43 AM by Tommie Raymond, CMA     Clinical Staff Provider  Office Location  [facility] Dating  L and 10wkUS  Language  English Anatomy US  Incomplete views   Flu Vaccine  offer Genetic Screen  NIPS: Neg/Female 04/22  TDaP vaccine   declined Hgb A1C or  GTT Early : Third trimester : 1h 151, 3h normal  Covid No boosters   LAB RESULTS   Rhogam  O/Positive/-- (04/22 0955)  Blood Type O/Positive/-- (04/22 0955)   Feeding Plan formal Antibody Negative (04/22 0955)  Contraception pill Rubella 1.15 (04/22 0955)  Circumcision yes RPR Non Reactive (04/22 0955)   Pediatrician  [facility] HBsAg Negative (04/22 0955)   Support Person Antonio and 2 friends HIV Non Reactive (04/22 0955)  Prenatal Classes no Varicella Immune    GBS  (For PCN allergy, check sensitivities)   BTL Consent  Hep C Non Reactive (04/22 0955)   VBAC Consent  Pap Diagnosis  Date Value Ref Range Status  04/29/2023   Final   - Negative for intraepithelial lesion or malignancy (NILM)      Hgb Electro      CF      SMA                    Preterm labor symptoms and general obstetric precautions including but not limited to vaginal  bleeding, contractions, leaking of fluid and fetal movement were reviewed in detail with the patient. Please refer to After Visit Summary for other counseling recommendations.   Return in about 2 weeks (around 10/14/2023) for return OB, cultures.  Mirna Mires, CNM  09/30/2023 12:34 PM

## 2023-09-30 NOTE — Procedures (Signed)
Alisha Morales 1985/08/18 [redacted]w[redacted]d  Fetus A Non-Stress Test Interpretation for 09/30/23  Indication: Chronic Hypertenstion and AMA, obesity, uterine fibroid, stillbirth of twins at 21 weeks   Fetal Heart Rate A Mode: External Baseline Rate (A): 145 bpm Variability: Moderate Accelerations: 15 x 15 Decelerations: None Multiple birth?: No  Uterine Activity Mode: Toco, Palpation Contraction Frequency (min): rare Contraction Duration (sec): 60 Contraction Quality: Mild Resting Tone Palpated: Relaxed Resting Time: Adequate  Interpretation (Fetal Testing) Nonstress Test Interpretation: Reactive Overall Impression: Reassuring for gestational age Comments: Dr. Grace Bushy reviewed

## 2023-10-07 ENCOUNTER — Other Ambulatory Visit: Payer: Self-pay

## 2023-10-07 ENCOUNTER — Ambulatory Visit: Payer: BC Managed Care – PPO | Attending: Maternal & Fetal Medicine

## 2023-10-07 ENCOUNTER — Ambulatory Visit: Payer: BC Managed Care – PPO

## 2023-10-07 VITALS — BP 154/92 | HR 73 | Temp 98.6°F | Resp 16 | Ht 62.0 in | Wt 274.0 lb

## 2023-10-07 DIAGNOSIS — Z3A35 35 weeks gestation of pregnancy: Secondary | ICD-10-CM | POA: Diagnosis not present

## 2023-10-07 DIAGNOSIS — E669 Obesity, unspecified: Secondary | ICD-10-CM | POA: Diagnosis not present

## 2023-10-07 DIAGNOSIS — O10013 Pre-existing essential hypertension complicating pregnancy, third trimester: Secondary | ICD-10-CM

## 2023-10-07 DIAGNOSIS — O09213 Supervision of pregnancy with history of pre-term labor, third trimester: Secondary | ICD-10-CM | POA: Diagnosis not present

## 2023-10-07 DIAGNOSIS — O10913 Unspecified pre-existing hypertension complicating pregnancy, third trimester: Secondary | ICD-10-CM | POA: Diagnosis not present

## 2023-10-07 DIAGNOSIS — O99213 Obesity complicating pregnancy, third trimester: Secondary | ICD-10-CM | POA: Insufficient documentation

## 2023-10-07 DIAGNOSIS — O09523 Supervision of elderly multigravida, third trimester: Secondary | ICD-10-CM | POA: Insufficient documentation

## 2023-10-07 DIAGNOSIS — D259 Leiomyoma of uterus, unspecified: Secondary | ICD-10-CM | POA: Insufficient documentation

## 2023-10-07 DIAGNOSIS — O09293 Supervision of pregnancy with other poor reproductive or obstetric history, third trimester: Secondary | ICD-10-CM | POA: Diagnosis not present

## 2023-10-07 DIAGNOSIS — O3413 Maternal care for benign tumor of corpus uteri, third trimester: Secondary | ICD-10-CM | POA: Insufficient documentation

## 2023-10-07 NOTE — Procedures (Signed)
Dr. Grace Bushy reviewed tracing

## 2023-10-14 ENCOUNTER — Ambulatory Visit (INDEPENDENT_AMBULATORY_CARE_PROVIDER_SITE_OTHER): Payer: BC Managed Care – PPO | Admitting: Obstetrics & Gynecology

## 2023-10-14 ENCOUNTER — Other Ambulatory Visit (HOSPITAL_COMMUNITY)
Admission: RE | Admit: 2023-10-14 | Discharge: 2023-10-14 | Disposition: A | Discharge status: Home / Self Care | Payer: BC Managed Care – PPO | Source: Ambulatory Visit | Attending: Obstetrics & Gynecology | Admitting: Obstetrics & Gynecology

## 2023-10-14 ENCOUNTER — Other Ambulatory Visit: Payer: Self-pay

## 2023-10-14 ENCOUNTER — Ambulatory Visit: Payer: BC Managed Care – PPO | Admitting: Obstetrics

## 2023-10-14 ENCOUNTER — Ambulatory Visit: Payer: BC Managed Care – PPO | Attending: Obstetrics | Admitting: Obstetrics

## 2023-10-14 VITALS — HR 71 | Wt 273.9 lb

## 2023-10-14 VITALS — BP 143/90 | HR 81 | Temp 98.1°F | Resp 17 | Ht 62.0 in | Wt 273.6 lb

## 2023-10-14 DIAGNOSIS — O99210 Obesity complicating pregnancy, unspecified trimester: Secondary | ICD-10-CM

## 2023-10-14 DIAGNOSIS — O09293 Supervision of pregnancy with other poor reproductive or obstetric history, third trimester: Secondary | ICD-10-CM | POA: Diagnosis not present

## 2023-10-14 DIAGNOSIS — O0990 Supervision of high risk pregnancy, unspecified, unspecified trimester: Secondary | ICD-10-CM

## 2023-10-14 DIAGNOSIS — R8271 Bacteriuria: Secondary | ICD-10-CM

## 2023-10-14 DIAGNOSIS — Z3A36 36 weeks gestation of pregnancy: Secondary | ICD-10-CM

## 2023-10-14 DIAGNOSIS — O09523 Supervision of elderly multigravida, third trimester: Secondary | ICD-10-CM

## 2023-10-14 DIAGNOSIS — D259 Leiomyoma of uterus, unspecified: Secondary | ICD-10-CM

## 2023-10-14 DIAGNOSIS — E669 Obesity, unspecified: Secondary | ICD-10-CM | POA: Diagnosis not present

## 2023-10-14 DIAGNOSIS — D649 Anemia, unspecified: Secondary | ICD-10-CM | POA: Insufficient documentation

## 2023-10-14 DIAGNOSIS — O10013 Pre-existing essential hypertension complicating pregnancy, third trimester: Secondary | ICD-10-CM

## 2023-10-14 DIAGNOSIS — O99013 Anemia complicating pregnancy, third trimester: Secondary | ICD-10-CM | POA: Diagnosis not present

## 2023-10-14 DIAGNOSIS — O163 Unspecified maternal hypertension, third trimester: Secondary | ICD-10-CM | POA: Diagnosis present

## 2023-10-14 DIAGNOSIS — O10913 Unspecified pre-existing hypertension complicating pregnancy, third trimester: Secondary | ICD-10-CM

## 2023-10-14 DIAGNOSIS — D219 Benign neoplasm of connective and other soft tissue, unspecified: Secondary | ICD-10-CM | POA: Diagnosis not present

## 2023-10-14 DIAGNOSIS — O0993 Supervision of high risk pregnancy, unspecified, third trimester: Secondary | ICD-10-CM | POA: Insufficient documentation

## 2023-10-14 DIAGNOSIS — O99213 Obesity complicating pregnancy, third trimester: Secondary | ICD-10-CM

## 2023-10-14 DIAGNOSIS — O3413 Maternal care for benign tumor of corpus uteri, third trimester: Secondary | ICD-10-CM | POA: Diagnosis not present

## 2023-10-14 DIAGNOSIS — O09299 Supervision of pregnancy with other poor reproductive or obstetric history, unspecified trimester: Secondary | ICD-10-CM

## 2023-10-14 NOTE — Progress Notes (Signed)
   PRENATAL VISIT NOTE  Subjective:  Alisha Morales is a 38 y.o. G3P1101 at [redacted]w[redacted]d being seen today for ongoing prenatal care.  She is currently monitored for the following issues for this high-risk pregnancy and has Supervision of high risk pregnancy, antepartum; Maternal chronic hypertension, third trimester; Obesity affecting pregnancy; GBS bacteriuria; AMA (advanced maternal age) multigravida 35+, third trimester; Pregnancy with poor obstetric history; Uterine fibroid in pregnancy; and Labor and delivery, indication for care on their problem list.  Patient reports no complaints.  Contractions: Not present. Vag. Bleeding: None.  Movement: Present. Denies leaking of fluid.   The following portions of the patient's history were reviewed and updated as appropriate: allergies, current medications, past family history, past medical history, past social history, past surgical history and problem list.   Objective:   Vitals:   10/14/23 1102  Pulse: 71  Weight: 273 lb 14.4 oz (124.2 kg)    Fetal Status:     Movement: Present     General:  Alert, oriented and cooperative. Patient is in no acute distress.  Skin: Skin is warm and dry. No rash noted.   Cardiovascular: Normal heart rate noted  Respiratory: Normal respiratory effort, no problems with respiration noted  Abdomen: Soft, gravid, appropriate for gestational age.  Pain/Pressure: Present     Pelvic: She declines cervical exam.  Extremities: Normal range of motion.  Edema: Trace  Mental Status: Normal mood and affect. Normal behavior. Normal judgment and thought content.   Assessment and Plan:  Pregnancy: G3P1101 at [redacted]w[redacted]d  Preterm labor symptoms and general obstetric precautions including but not limited to vaginal bleeding, contractions, leaking of fluid and fetal movement were reviewed in detail with the patient. Please refer to After Visit Summary for other counseling recommendations.    Per MFM, IOL scheduled for Tuesday. Will  plan for NST on Monday She has GBS in urine- will need treatment. We have reviewed pre eclampsia precautions.  Allie Bossier, MD

## 2023-10-14 NOTE — Procedures (Signed)
Alisha Morales 1985/01/29 [redacted]w[redacted]d  Fetus A Non-Stress Test Interpretation for 10/14/23  Indication: Chronic Hypertenstion, AMA, Hx stillbirth of 21w twins, fibroid, anemia  Fetal Heart Rate A Mode: External Baseline Rate (A): 140 bpm Variability: Moderate Accelerations: 15 x 15 Decelerations: None Multiple birth?: No  Uterine Activity Mode: Palpation, Toco Contraction Frequency (min): rare Contraction Duration (sec): 70 Contraction Quality: Mild Resting Tone Palpated: Relaxed Resting Time: Adequate  Interpretation (Fetal Testing) Nonstress Test Interpretation: Reactive Overall Impression: Reassuring for gestational age Comments: Dr. Parke Poisson

## 2023-10-14 NOTE — Progress Notes (Signed)
MFM Note  Alisha Morales is currently at 36 weeks and 1 day.  Her pregnancy has been complicated by advanced maternal age, maternal obesity, and chronic hypertension treated with Procardia XL 60 mg daily.    Her blood pressures remain elevated in the 140s/90s to 140/high 80s range.  She denies any signs or symptoms of preeclampsia.    She had a reactive NST today.  Due to her persistently elevated blood pressures despite treatment with antihypertensive medications, delivery may be considered at between 37 to 38 weeks (sometime next week).    The patient will speak to you about scheduling delivery at her next prenatal visit later today.    She should have another NST performed in your office early next week if she remains undelivered.    The patient stated that she was comfortable with this plan and stated that all of her questions were answered.    A total of 20 minutes was spent counseling and coordinating the care for this patient.  Greater than 50% of the time was spent in direct face-to-face contact.Marland Kitchen

## 2023-10-15 ENCOUNTER — Encounter: Payer: Self-pay | Admitting: Obstetrics & Gynecology

## 2023-10-15 LAB — CERVICOVAGINAL ANCILLARY ONLY
Chlamydia: NEGATIVE
Comment: NEGATIVE
Comment: NORMAL
Neisseria Gonorrhea: NEGATIVE

## 2023-10-21 ENCOUNTER — Ambulatory Visit: Payer: BC Managed Care – PPO

## 2023-10-21 ENCOUNTER — Ambulatory Visit (INDEPENDENT_AMBULATORY_CARE_PROVIDER_SITE_OTHER): Payer: BC Managed Care – PPO | Admitting: Obstetrics

## 2023-10-21 VITALS — BP 147/101 | HR 78 | Wt 272.4 lb

## 2023-10-21 VITALS — BP 157/96 | HR 73 | Ht 62.0 in | Wt 272.4 lb

## 2023-10-21 DIAGNOSIS — O99820 Streptococcus B carrier state complicating pregnancy: Secondary | ICD-10-CM

## 2023-10-21 DIAGNOSIS — O99213 Obesity complicating pregnancy, third trimester: Secondary | ICD-10-CM | POA: Diagnosis not present

## 2023-10-21 DIAGNOSIS — O10013 Pre-existing essential hypertension complicating pregnancy, third trimester: Secondary | ICD-10-CM

## 2023-10-21 DIAGNOSIS — O09523 Supervision of elderly multigravida, third trimester: Secondary | ICD-10-CM | POA: Diagnosis not present

## 2023-10-21 DIAGNOSIS — O99210 Obesity complicating pregnancy, unspecified trimester: Secondary | ICD-10-CM

## 2023-10-21 DIAGNOSIS — Z3A37 37 weeks gestation of pregnancy: Secondary | ICD-10-CM

## 2023-10-21 DIAGNOSIS — R8271 Bacteriuria: Secondary | ICD-10-CM

## 2023-10-21 DIAGNOSIS — O10913 Unspecified pre-existing hypertension complicating pregnancy, third trimester: Secondary | ICD-10-CM

## 2023-10-21 DIAGNOSIS — E669 Obesity, unspecified: Secondary | ICD-10-CM

## 2023-10-21 DIAGNOSIS — O0990 Supervision of high risk pregnancy, unspecified, unspecified trimester: Secondary | ICD-10-CM

## 2023-10-21 NOTE — Patient Instructions (Signed)

## 2023-10-21 NOTE — Progress Notes (Deleted)
Error

## 2023-10-21 NOTE — Progress Notes (Signed)
    NURSE VISIT NOTE  Subjective:    Patient ID: Alisha Morales, female    DOB: 24-Feb-1985, 38 y.o.   MRN: [id number]  HPI  Patient is a 38 y.o. G60P1101 female who presents for fetal monitoring per order from Nicholaus Bloom, MD.   Objective:    BP (!) 157/96   Pulse 73   Ht 5\' 2"  (1.575 m)   Wt 272 lb 6.4 oz (123.6 kg)   LMP 02/03/2023 (Exact Date)   BMI 49.82 kg/m  Estimated Date of Delivery: 11/10/23  [redacted]w[redacted]d  Fetus A Non-Stress Test Interpretation for 10/21/23  Indication: Advanced Maternal Age >39 years, Chronic Hypertenstion, and Obesity  Fetal Heart Rate A Mode: External Baseline Rate (A): 140 bpm Variability: Moderate Accelerations: 15 x 15 Decelerations: None Multiple birth?: No  Uterine Activity Mode: Toco Contraction Frequency (min): UI  Interpretation (Fetal Testing) Nonstress Test Interpretation: Reactive Overall Impression: Reassuring for gestational age   Assessment:   1. AMA (advanced maternal age) multigravida 35+, third trimester   2. Maternal chronic hypertension, third trimester   3. Obesity affecting pregnancy, antepartum, unspecified obesity type   4. [redacted] weeks gestation of pregnancy      Plan:   Results reviewed and discussed with patient by  Julieanne Manson, MD.     Alisha Serene, LPN

## 2023-10-21 NOTE — Progress Notes (Signed)
    Return Prenatal Note   Subjective  38 y.o. G3P1101 at [redacted]w[redacted]d presents for this follow-up prenatal visit. Pregnancy c/b cHTN, AMA, BMI.  Patient's BP today elevated, denies headaches, RUQ pain, shortness of breath or vision changes. She is being induced tonight at midnight for her cHTN per MFM.   Patient reports: Movement: Present Contractions: Not present Denies vaginal bleeding or leaking fluid. Objective  Flow sheet Vitals: Pulse Rate: 78 BP: (!) 147/101 Fundal Height: 38 cm Fetal Heart Rate (bpm): 140 Total weight gain: 42 lb 6.4 oz (19.2 kg)  General Appearance  No acute distress, well appearing, and well nourished Pulmonary   Normal work of breathing Neurologic   Alert and oriented to person, place, and time Psychiatric   Mood and affect within normal limits  NST: reactive  Assessment/Plan   Plan  38 y.o. G3P1101 at [redacted]w[redacted]d presents for follow-up OB visit. Reviewed prenatal record including previous visit note.  1. Maternal chronic hypertension, third trimester  2. Supervision of high risk pregnancy, antepartum  3. Obesity affecting pregnancy in third trimester, unspecified obesity type  4. GBS bacteriuria  5. AMA (advanced maternal age) multigravida 35+, third trimester  -Medical IOL for cHTN tonight at 0000. Orders signed & held.  -PCN prophylaxis for GBS -Reviewed IOL plan of care and expectations   Julieanne Manson, DO [facility]

## 2023-10-22 ENCOUNTER — Inpatient Hospital Stay: Payer: BC Managed Care – PPO | Admitting: Anesthesiology

## 2023-10-22 ENCOUNTER — Inpatient Hospital Stay
Admission: EM | Admit: 2023-10-22 | Discharge: 2023-10-24 | DRG: 807 | Disposition: A | Discharge status: Home / Self Care | Payer: BC Managed Care – PPO

## 2023-10-22 ENCOUNTER — Encounter: Payer: Self-pay | Admitting: Obstetrics

## 2023-10-22 ENCOUNTER — Other Ambulatory Visit: Payer: Self-pay

## 2023-10-22 DIAGNOSIS — Z7982 Long term (current) use of aspirin: Secondary | ICD-10-CM

## 2023-10-22 DIAGNOSIS — E669 Obesity, unspecified: Secondary | ICD-10-CM | POA: Diagnosis not present

## 2023-10-22 DIAGNOSIS — Z3A37 37 weeks gestation of pregnancy: Secondary | ICD-10-CM

## 2023-10-22 DIAGNOSIS — R8271 Bacteriuria: Secondary | ICD-10-CM | POA: Diagnosis present

## 2023-10-22 DIAGNOSIS — O1092 Unspecified pre-existing hypertension complicating childbirth: Principal | ICD-10-CM | POA: Diagnosis present

## 2023-10-22 DIAGNOSIS — D259 Leiomyoma of uterus, unspecified: Secondary | ICD-10-CM | POA: Diagnosis not present

## 2023-10-22 DIAGNOSIS — O99210 Obesity complicating pregnancy, unspecified trimester: Secondary | ICD-10-CM | POA: Diagnosis present

## 2023-10-22 DIAGNOSIS — O1002 Pre-existing essential hypertension complicating childbirth: Secondary | ICD-10-CM

## 2023-10-22 DIAGNOSIS — O0990 Supervision of high risk pregnancy, unspecified, unspecified trimester: Secondary | ICD-10-CM

## 2023-10-22 DIAGNOSIS — O3413 Maternal care for benign tumor of corpus uteri, third trimester: Secondary | ICD-10-CM | POA: Diagnosis present

## 2023-10-22 DIAGNOSIS — O99214 Obesity complicating childbirth: Secondary | ICD-10-CM | POA: Diagnosis present

## 2023-10-22 DIAGNOSIS — O10919 Unspecified pre-existing hypertension complicating pregnancy, unspecified trimester: Principal | ICD-10-CM | POA: Diagnosis present

## 2023-10-22 DIAGNOSIS — O99824 Streptococcus B carrier state complicating childbirth: Secondary | ICD-10-CM | POA: Diagnosis not present

## 2023-10-22 DIAGNOSIS — O99820 Streptococcus B carrier state complicating pregnancy: Secondary | ICD-10-CM | POA: Diagnosis not present

## 2023-10-22 DIAGNOSIS — O10913 Unspecified pre-existing hypertension complicating pregnancy, third trimester: Secondary | ICD-10-CM

## 2023-10-22 HISTORY — DX: Unspecified pre-existing hypertension complicating pregnancy, unspecified trimester: O10.919

## 2023-10-22 LAB — URINALYSIS, ROUTINE W REFLEX MICROSCOPIC
Bilirubin Urine: NEGATIVE
Glucose, UA: NEGATIVE mg/dL
Hgb urine dipstick: NEGATIVE
Ketones, ur: NEGATIVE mg/dL
Nitrite: NEGATIVE
Protein, ur: NEGATIVE mg/dL
Specific Gravity, Urine: 1.009 (ref 1.005–1.030)
pH: 6 (ref 5.0–8.0)

## 2023-10-22 LAB — CBC
HCT: 34.8 % — ABNORMAL LOW (ref 36.0–46.0)
Hemoglobin: 11.5 g/dL — ABNORMAL LOW (ref 12.0–15.0)
MCH: 29 pg (ref 26.0–34.0)
MCHC: 33 g/dL (ref 30.0–36.0)
MCV: 87.9 fL (ref 80.0–100.0)
Platelets: 280 10*3/uL (ref 150–400)
RBC: 3.96 MIL/uL (ref 3.87–5.11)
RDW: 13.9 % (ref 11.5–15.5)
WBC: 7.6 10*3/uL (ref 4.0–10.5)
nRBC: 0 % (ref 0.0–0.2)

## 2023-10-22 LAB — COMPREHENSIVE METABOLIC PANEL
ALT: 16 U/L (ref 0–44)
AST: 19 U/L (ref 15–41)
Albumin: 2.8 g/dL — ABNORMAL LOW (ref 3.5–5.0)
Alkaline Phosphatase: 96 U/L (ref 38–126)
Anion gap: 8 (ref 5–15)
BUN: 11 mg/dL (ref 6–20)
CO2: 22 mmol/L (ref 22–32)
Calcium: 8.9 mg/dL (ref 8.9–10.3)
Chloride: 104 mmol/L (ref 98–111)
Creatinine, Ser: 0.53 mg/dL (ref 0.44–1.00)
GFR, Estimated: >60 mL/min (ref >60)
Glucose, Bld: 132 mg/dL — ABNORMAL HIGH (ref 70–99)
Potassium: 3.9 mmol/L (ref 3.5–5.1)
Sodium: 134 mmol/L — ABNORMAL LOW (ref 135–145)
Total Bilirubin: 0.3 mg/dL (ref 0.3–1.2)
Total Protein: 6.7 g/dL (ref 6.5–8.1)

## 2023-10-22 LAB — PROTEIN / CREATININE RATIO, URINE
Creatinine, Urine: 71 mg/dL
Protein Creatinine Ratio: 0.23 mg/mg{creat} — ABNORMAL HIGH (ref 0.00–0.15)
Total Protein, Urine: 16 mg/dL

## 2023-10-22 LAB — TYPE AND SCREEN
ABO/RH(D): O POS
Antibody Screen: NEGATIVE

## 2023-10-22 LAB — RPR: RPR Ser Ql: NONREACTIVE

## 2023-10-22 LAB — ABO/RH: ABO/RH(D): O POS

## 2023-10-22 LAB — URIC ACID: Uric Acid, Serum: 5.7 mg/dL (ref 2.5–7.1)

## 2023-10-22 LAB — LACTATE DEHYDROGENASE: LDH: 148 U/L (ref 98–192)

## 2023-10-22 MED ORDER — MISOPROSTOL 25 MCG QUARTER TABLET
25.0000 ug | ORAL_TABLET | ORAL | Status: DC
  Administered 2023-10-22: 25 ug via VAGINAL
  Filled 2023-10-22: qty 1

## 2023-10-22 MED ORDER — LACTATED RINGERS IV SOLN: INTRAVENOUS | Status: DC

## 2023-10-22 MED ORDER — EPHEDRINE 5 MG/ML INJ: 10.0000 mg | INTRAVENOUS | Status: DC | PRN

## 2023-10-22 MED ORDER — MISOPROSTOL 50MCG HALF TABLET: 50.0000 ug | ORAL_TABLET | ORAL | Status: DC | PRN

## 2023-10-22 MED ORDER — PHENYLEPHRINE 80 MCG/ML (10ML) SYRINGE FOR IV PUSH (FOR BLOOD PRESSURE SUPPORT): 80.0000 ug | PREFILLED_SYRINGE | INTRAVENOUS | Status: DC | PRN

## 2023-10-22 MED ORDER — LIDOCAINE HCL (PF) 1 % IJ SOLN
30.0000 mL | INTRAMUSCULAR | Status: DC | PRN
Start: 2023-10-22 — End: 2023-10-23

## 2023-10-22 MED ORDER — OXYTOCIN-SODIUM CHLORIDE 30-0.9 UT/500ML-% IV SOLN
1.0000 m[IU]/min | INTRAVENOUS | Status: DC
  Administered 2023-10-22: 2 m[IU]/min via INTRAVENOUS
  Filled 2023-10-22: qty 500

## 2023-10-22 MED ORDER — BUPIVACAINE HCL (PF) 0.25 % IJ SOLN
INTRAMUSCULAR | Status: DC | PRN
  Administered 2023-10-22 (×2): 3 mL via EPIDURAL

## 2023-10-22 MED ORDER — MISOPROSTOL 25 MCG QUARTER TABLET
25.0000 ug | ORAL_TABLET | Freq: Once | ORAL | Status: AC
  Administered 2023-10-22: 25 ug via VAGINAL
  Filled 2023-10-22: qty 1

## 2023-10-22 MED ORDER — LIDOCAINE HCL (PF) 1 % IJ SOLN
INTRAMUSCULAR | Status: DC | PRN
  Administered 2023-10-22: 2 mL
  Administered 2023-10-22: 3 mL

## 2023-10-22 MED ORDER — SODIUM CHLORIDE 0.9 % IV SOLN
5.0000 10*6.[IU] | Freq: Once | INTRAVENOUS | Status: AC
  Administered 2023-10-22: 5 10*6.[IU] via INTRAVENOUS
  Filled 2023-10-22: qty 5

## 2023-10-22 MED ORDER — OXYTOCIN BOLUS FROM INFUSION
333.0000 mL | Freq: Once | INTRAVENOUS | Status: AC
  Administered 2023-10-22: 333 mL via INTRAVENOUS

## 2023-10-22 MED ORDER — DIPHENHYDRAMINE HCL 50 MG/ML IJ SOLN: 12.5000 mg | INTRAMUSCULAR | Status: DC | PRN

## 2023-10-22 MED ORDER — LIDOCAINE-EPINEPHRINE (PF) 1.5 %-1:200000 IJ SOLN
INTRAMUSCULAR | Status: DC | PRN
  Administered 2023-10-22: 3 mL via PERINEURAL

## 2023-10-22 MED ORDER — ZOLPIDEM TARTRATE 5 MG PO TABS: 5.0000 mg | ORAL_TABLET | Freq: Every evening | ORAL | Status: DC | PRN

## 2023-10-22 MED ORDER — FENTANYL CITRATE (PF) 100 MCG/2ML IJ SOLN
50.0000 ug | INTRAMUSCULAR | Status: DC | PRN
  Administered 2023-10-22: 50 ug via INTRAVENOUS
  Filled 2023-10-22: qty 2

## 2023-10-22 MED ORDER — ONDANSETRON HCL 4 MG/2ML IJ SOLN
4.0000 mg | Freq: Four times a day (QID) | INTRAMUSCULAR | Status: DC | PRN
  Administered 2023-10-22: 4 mg via INTRAVENOUS
  Filled 2023-10-22 (×2): qty 2

## 2023-10-22 MED ORDER — SOD CITRATE-CITRIC ACID 500-334 MG/5ML PO SOLN: 30.0000 mL | ORAL | Status: DC | PRN

## 2023-10-22 MED ORDER — OXYTOCIN-SODIUM CHLORIDE 30-0.9 UT/500ML-% IV SOLN: 2.5000 [IU]/h | INTRAVENOUS | Status: DC

## 2023-10-22 MED ORDER — PENICILLIN G POT IN DEXTROSE 60000 UNIT/ML IV SOLN
3.0000 10*6.[IU] | INTRAVENOUS | Status: DC
  Administered 2023-10-22 (×2): 3 10*6.[IU] via INTRAVENOUS
  Filled 2023-10-22 (×2): qty 50

## 2023-10-22 MED ORDER — MISOPROSTOL 25 MCG QUARTER TABLET
25.0000 ug | ORAL_TABLET | Freq: Once | ORAL | Status: AC
  Administered 2023-10-22: 25 ug via ORAL
  Filled 2023-10-22: qty 1

## 2023-10-22 MED ORDER — LACTATED RINGERS IV SOLN
500.0000 mL | Freq: Once | INTRAVENOUS | Status: AC
  Administered 2023-10-22: 500 mL via INTRAVENOUS

## 2023-10-22 MED ORDER — TERBUTALINE SULFATE 1 MG/ML IJ SOLN: 0.2500 mg | Freq: Once | INTRAMUSCULAR | Status: DC | PRN

## 2023-10-22 MED ORDER — ACETAMINOPHEN 500 MG PO TABS: 1000.0000 mg | ORAL_TABLET | Freq: Four times a day (QID) | ORAL | Status: DC | PRN

## 2023-10-22 MED ORDER — HYDROXYZINE HCL 25 MG PO TABS: 50.0000 mg | ORAL_TABLET | Freq: Four times a day (QID) | ORAL | Status: DC | PRN

## 2023-10-22 MED ORDER — FENTANYL-BUPIVACAINE-NACL 0.5-0.125-0.9 MG/250ML-% EP SOLN
12.0000 mL/h | EPIDURAL | Status: DC | PRN
  Administered 2023-10-22: 12 mL/h via EPIDURAL
  Filled 2023-10-22: qty 250

## 2023-10-22 MED ORDER — LACTATED RINGERS IV SOLN
500.0000 mL | INTRAVENOUS | Status: DC | PRN
  Administered 2023-10-22: 500 mL via INTRAVENOUS

## 2023-10-22 MED ORDER — SODIUM CHLORIDE (PF) 0.9 % IJ SOLN
INTRAMUSCULAR | Status: AC
  Filled 2023-10-22: qty 100

## 2023-10-22 MED ORDER — CALCIUM CARBONATE ANTACID 500 MG PO CHEW: 2.0000 | CHEWABLE_TABLET | Freq: Three times a day (TID) | ORAL | Status: DC | PRN

## 2023-10-22 NOTE — Progress Notes (Signed)
Labor Progress Note   ASSESSMENT/PLAN   Alisha Morales 38 y.o.   G3P1101  at [redacted]w[redacted]d here with induction of labor in the setting of cHTN.  FWB:  - Fetal well being assessed:Cat II, occasional late decelerations and variables, otherwise reassuring, patient repositioned.         GBS: - GBS positive, now with adequate PCN prophylaxis  LABOR: - Now in active labor, doing well. - Pain Management: epidural in place. - Discussed options with patient and will continue to titrate pitocin per policy. Encouraged frequent position changes to facilitate fetal descent. - Anticipate SVD   Labor Progress 0715  0/th/high, Miso#1  0945  1/50/-3, Cook catheter placed 1208  Miso#2 1445  Cook catheter expelled, patient requesting epidural 1700  5/70/-2, AROM (clr) 2010  6/90/-2   SUBJECTIVE/OBJECTIVE   SUBJECTIVE:  Remains comfortable with epidural, no complaints at this time.   OBJECTIVE: Vital Signs: Patient Vitals for the past 12 hrs:  BP Temp Temp src Pulse SpO2  10/22/23 1848 (!) 141/73 -- -- 75 --  10/22/23 1826 -- 98.6 F (37 C) Oral -- --  10/22/23 1748 126/63 -- -- 68 --  10/22/23 1708 -- 97.9 F (36.6 C) Oral -- --  10/22/23 1648 134/76 -- -- 66 --  10/22/23 1622 134/72 -- -- 75 --  10/22/23 1620 -- -- -- -- 99 %  10/22/23 1607 132/71 -- -- 76 --  10/22/23 1552 127/71 -- -- 76 --  10/22/23 1537 128/73 -- -- 77 --  10/22/23 1535 -- -- -- -- 98 %  10/22/23 1532 130/72 -- -- 75 --  10/22/23 1530 -- -- -- -- 99 %  10/22/23 1527 130/75 -- -- 84 --  10/22/23 1525 -- -- -- -- 98 %  10/22/23 1524 138/80 -- -- 76 --  10/22/23 1520 -- -- -- -- 100 %  10/22/23 1517 (!) 140/82 -- -- 73 --  10/22/23 1515 (!) 149/85 -- -- 67 96 %  10/22/23 1510 -- -- -- -- 98 %  10/22/23 1348 (!) 145/85 -- -- 69 --  10/22/23 1205 (!) 145/79 97.7 F (36.5 C) Oral 64 --  10/22/23 1044 119/70 -- -- 65 --  10/22/23 1030 118/69 -- -- 67 --  10/22/23 1014 112/71 -- -- 66 --  10/22/23 1004 107/63 -- --  64 --    Last SVE:  Dilation: 6 Effacement (%): 90 Cervical Position: Posterior Station: -2 Exam by:: Autumn Messing, CNM -  , Rupture Date: 10/22/23, Rupture Time: 1701,    FHR:   - Mode: External  - Baseline Rate (A): 150 bpm  -    - Characteristics (ie - accels, decels): Accelerations: 15 x 15  -    UTERINE ACTIVITY:   - Mode: Toco  - Contraction Frequency (min): 3 minutes Pitocin: 6 mU/min

## 2023-10-22 NOTE — Anesthesia Preprocedure Evaluation (Signed)
Anesthesia Evaluation  Patient identified by MRN, date of birth, ID band Patient awake    Reviewed: Allergy & Precautions, H&P , NPO status   Airway Mallampati: III       Dental no notable dental hx.    Pulmonary neg pulmonary ROS   Pulmonary exam normal        Cardiovascular hypertension, Normal cardiovascular exam     Neuro/Psych negative neurological ROS  negative psych ROS   GI/Hepatic negative GI ROS, Neg liver ROS,,,  Endo/Other  negative endocrine ROS    Renal/GU negative Renal ROS  negative genitourinary   Musculoskeletal   Abdominal   Peds  Hematology negative hematology ROS (+)   Anesthesia Other Findings   Reproductive/Obstetrics (+) Pregnancy                             Anesthesia Physical Anesthesia Plan  ASA: 2  Anesthesia Plan: Epidural   Post-op Pain Management:    Induction:   PONV Risk Score and Plan:   Airway Management Planned:   Additional Equipment:   Intra-op Plan:   Post-operative Plan:   Informed Consent: I have reviewed the patients History and Physical, chart, labs and discussed the procedure including the risks, benefits and alternatives for the proposed anesthesia with the patient or authorized representative who has indicated his/her understanding and acceptance.       Plan Discussed with: Anesthesiologist and CRNA  Anesthesia Plan Comments:        Anesthesia Quick Evaluation

## 2023-10-22 NOTE — Progress Notes (Signed)
Labor Progress Note   ASSESSMENT/PLAN   Alisha Morales 38 y.o.   [id number]  at [redacted]w[redacted]d here with induction in the setting of chronic hypertension managed with medication.  FWB:  - Fetal well being assessed: Cat I       GBS: - GBS positive, will plan for penicillin prophylaxis after cervical ripening.   LABOR: - Now awaiting cervical ripening, doing well. - Pain Management: IV fentanyl - Cook catheter placed with patient consent, uterine and vaginal balloons filled to 80 mL. Discussed plan with patient. Will plan to start pitocin and penicillin once [facility] catheter is expelled and consider AROM as appropriate.  - Anticipate SVD   Labor Progress 0715  0/th/high, Miso#1  0945  1/50/-3, Cook catheter placed   SUBJECTIVE/OBJECTIVE   SUBJECTIVE:  Having some mild contractions, feeling generally good. Ate breakfast and ready to consider next steps for induction.   OBJECTIVE: Vital Signs: Patient Vitals for the past 12 hrs:  BP Temp Temp src Pulse Resp Height Weight  10/22/23 0719 (!) 145/87 97.9 F (36.6 C) Oral 71 17 -- --  10/22/23 0636 133/85 -- -- 74 -- -- --  10/22/23 0632 -- -- Oral -- 14 5\' 2"  (1.575 m) 123.6 kg  10/22/23 0614 -- 99.2 F (37.3 C) Oral -- 14 -- --  10/22/23 0613 (!) 144/98 -- -- 88 -- -- --    Last SVE:  Dilation: 1 Effacement (%): 50 Cervical Position: Posterior Station: -3 Exam by:: Briel Gallicchio,CNM -  ,  ,  ,    FHR:   - Mode: External  - Baseline Rate (A): 140 bpm  -    - Characteristics (ie - accels, decels): Accelerations: 15 x 15  -    UTERINE ACTIVITY:   - Mode: Toco  - Contraction Frequency (min): occasional minutes

## 2023-10-22 NOTE — H&P (Signed)
History and Physical   HPI  Alisha Morales is a 38 y.o. G3P1101 at [redacted]w[redacted]d Estimated Date of Delivery: 11/10/23 by LMP c/w 10wk u/s, who is being admitted for induction of labor secondary to [facility]. Reports good fetal movement. Denies VB, LOF, HA, visual changes or RUQ pain.   Alisha Morales started prenatal care at 8 weeks. Had consistent and routine care complicated by [facility] (currently taking procardia 60mg  XL daily),.GBS positive, advanced maternal age, hx of twin birth at [redacted] weeks EGA and obesity with BMI of 49.    OB History  OB History  Gravida Para Term Preterm AB Living  3 2 1 1  0 1  SAB IAB Ectopic Multiple Live Births  0 0 0 0 1    # Outcome Date GA Lbr Len/2nd Weight Sex Type Anes PTL Lv  3 Current           2 Term 08/08/09 [redacted]w[redacted]d  3827 g M Vag-Spont  N LIV  1 Preterm 2005 [redacted]w[redacted]d    Vag-Spont   FD     Birth Comments: TWINS    PROBLEM LIST  Pregnancy complications or risks: Patient Active Problem List   Diagnosis Date Noted   Chronic hypertension in pregnancy 10/22/2023   [redacted] weeks gestation of pregnancy 10/21/2023   Labor and delivery, indication for care 09/24/2023   GBS bacteriuria 09/09/2023   AMA (advanced maternal age) multigravida 35+, third trimester 09/09/2023   Pregnancy with poor obstetric history 09/09/2023   Uterine fibroid in pregnancy 09/09/2023   Obesity affecting pregnancy 08/26/2023   Maternal chronic hypertension, third trimester 08/12/2023   Supervision of high risk pregnancy, antepartum 04/01/2023    Prenatal labs and studies: ABO, Rh: --/--/PENDING (10/22 3295) Antibody: PENDING (10/22 0651) Rubella: 1.15 (04/22 0955) RPR: Non Reactive (08/12 1028)  HBsAg: Negative (04/22 0955)  HIV: Non Reactive (08/12 1028)  GBS: positive in urine H&H:  Hemoglobin  Date Value Ref Range Status  10/22/2023 11.5 (L) 12.0 - 15.0 g/dL Final  18/84/1660 63.0 (L) 12.0 - 15.0 g/dL Final  16/12/930 35.5 (L) 11.1 - 15.9 g/dL Final  73/22/0254 27.0  11.1 - 15.9 g/dL Final  62/37/6283 15.1 (L) 11.1 - 15.9 g/dL Final  76/16/0737 9.6 (L) 12.0 - 15.0 g/dL Final  10/62/6948 54.6 11.1 - 15.9 g/dL Final   & hematocrit  GC/CT: neg/neg   Past Medical History:  Diagnosis Date   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 Can increase to twice a day as needed for symptomatic contractions Qty: 90 tablet, Refills: 12    Prenatal Vit-Fe Fumarate-FA (MULTIVITAMIN-PRENATAL) 27-0.8 MG TABS tablet Take 1 tablet by mouth daily at 12 noon.    aspirin EC 81 MG tablet Take 1 tablet (81 mg total) by mouth daily. Swallow whole. Qty: 30 tablet, Refills: 12         Allergies  Patient has no known allergies.  Review of Systems  Pertinent items noted in HPI and remainder of comprehensive ROS otherwise negative.  Physical Exam  BP (!) 145/87   Pulse 71   Temp 97.9 F (36.6 C) (Oral)   Resp  17   Ht 5\' 2"  (1.575 m)   Wt 123.6 kg   LMP 02/03/2023 (Exact Date)   BMI 49.82 kg/m   Lungs:  CTA B Cardio: S1S2, RRR Abd: Soft, gravid, NT Presentation: cephalic DTRs: 2+ B SVE: closed, thick, posterior  FHR 140, mod variability, pos accels, no decels Toco no contractions detected   Test Results  Results for orders placed or performed during the hospital encounter of 10/22/23 (from the past 24 hour(s))  Type and screen     Status: None (Preliminary result)   Collection Time: 10/22/23  6:51 AM  Result Value Ref Range   ABO/RH(D) PENDING    Antibody Screen PENDING    Sample Expiration      10/25/2023,2359 Performed at [facility], [address] [place], [place] [address]   CBC     Status: Abnormal   Collection Time:  10/22/23  6:52 AM  Result Value Ref Range   WBC 7.6 4.0 - 10.5 K/uL   RBC 3.96 3.87 - 5.11 MIL/uL   Hemoglobin 11.5 (L) 12.0 - 15.0 g/dL   HCT 19.1 (L) 47.8 - 29.5 %   MCV 87.9 80.0 - 100.0 fL   MCH 29.0 26.0 - 34.0 pg   MCHC 33.0 30.0 - 36.0 g/dL   RDW 62.1 30.8 - 65.7 %   Platelets 280 150 - 400 K/uL   nRBC 0.0 0.0 - 0.2 %   Group B Strep positive  Assessment  G3P1101 at [redacted]w[redacted]d Estimated Date of Delivery: 11/10/23  RNST IOL for CHTN GBS pos  Patient Active Problem List   Diagnosis Date Noted   Chronic hypertension in pregnancy 10/22/2023   [redacted] weeks gestation of pregnancy 10/21/2023   Labor and delivery, indication for care 09/24/2023   GBS bacteriuria 09/09/2023   AMA (advanced maternal age) multigravida 35+, third trimester 09/09/2023   Pregnancy with poor obstetric history 09/09/2023   Uterine fibroid in pregnancy 09/09/2023   Obesity affecting pregnancy 08/26/2023   Maternal chronic hypertension, third trimester 08/12/2023   Supervision of high risk pregnancy, antepartum 04/01/2023    Plan  1. Admit to L&D   2. EFM per unit policy 3. Labs : T&S, CBC, RPR, CMP, PC ratio 4. Dr. Logan Bores notified of patient admission and status  5. Start IOL with misoprostol for cervical ripening  Raeford Razor, CNM, FNP 10/22/2023 7:23 AM

## 2023-10-22 NOTE — Anesthesia Procedure Notes (Signed)
Epidural Patient location during procedure: OB Start time: 10/22/2023 3:01 PM End time: 10/22/2023 3:20 PM  Staffing Anesthesiologist: Piscitello, Cleda Mccreedy, MD Resident/CRNA: Hezzie Bump, CRNA Performed: resident/CRNA   Preanesthetic Checklist Completed: patient identified, IV checked, site marked, risks and benefits discussed, surgical consent, monitors and equipment checked, pre-op evaluation and timeout performed  Epidural Patient position: sitting Prep: ChloraPrep Patient monitoring: heart rate, continuous pulse ox and blood pressure Approach: midline Location: L3-L4 Injection technique: LOR saline  Needle:  Needle type: Tuohy  Needle gauge: 17 G Needle length: 9 cm and 9 Needle insertion depth: 9 cm Catheter type: closed end flexible Catheter size: 19 Gauge Catheter at skin depth: 15 cm Test dose: negative and 1.5% lidocaine with Epi 1:200 K  Assessment Sensory level: T10 Events: blood not aspirated, no cerebrospinal fluid, injection not painful, no injection resistance, no paresthesia and negative IV test  Additional Notes 2 attempt Pt. Evaluated and documentation done after procedure finished. Patient identified. Risks/Benefits/Options discussed with patient including but not limited to bleeding, infection, nerve damage, paralysis, failed block, incomplete pain control, headache, blood pressure changes, nausea, vomiting, reactions to medication both or allergic, itching and postpartum back pain. Confirmed with bedside nurse the patient's most recent platelet count. Confirmed with patient that they are not currently taking any anticoagulation, have any bleeding history or any family history of bleeding disorders. Patient expressed understanding and wished to proceed. All questions were answered. Sterile technique was used throughout the entire procedure. Please see nursing notes for vital signs. Test dose was given through epidural catheter and negative prior to  continuing to dose epidural or start infusion. Warning signs of high block given to the patient including shortness of breath, tingling/numbness in hands, complete motor block, or any concerning symptoms with instructions to call for help. Patient was given instructions on fall risk and not to get out of bed. All questions and concerns addressed with instructions to call with any issues or inadequate analgesia.    Patient tolerated the insertion well without immediate complications.Reason for block:procedure for pain

## 2023-10-22 NOTE — Discharge Summary (Shared)
Postpartum Discharge Summary  Date of Service updated***     Patient Name: Alisha Morales DOB: 04-05-1985 MRN: [id number]  Date of admission: 10/22/2023 Delivery date:10/22/2023 Delivering provider: Shirlee Whitmire, Lindalou Hose Date of discharge: 10/22/2023  Admitting diagnosis: Chronic hypertension in pregnancy [O10.919] Intrauterine pregnancy: [redacted]w[redacted]d     Secondary diagnosis:  Principal Problem:   Chronic hypertension in pregnancy Active Problems:   Supervision of high risk pregnancy, antepartum   Obesity affecting pregnancy   GBS bacteriuria   Uterine fibroid in pregnancy     Discharge diagnosis: Term Pregnancy Delivered and CHTN                                              Post partum procedures:{Postpartum procedures:23558} Augmentation: AROM, Pitocin, Cytotec, and IP Foley Complications: None  Hospital course: Induction of Labor With Vaginal Delivery   38 y.o. yo G3P1101 at [redacted]w[redacted]d was admitted to the hospital 10/22/2023 for induction of labor.  Indication for induction:  chronic hypertension .  Patient had an uncomplicated labor course. Membrane Rupture Time/Date: 5:01 PM,10/22/2023  Delivery Method:Vaginal, Spontaneous Operative Delivery:N/A Episiotomy: None Lacerations:  None Details of delivery can be found in separate delivery note.  Patient had a postpartum course complicated by***. Patient is discharged home 10/22/23.  Newborn Data: Birth date:10/22/2023 Birth time:11:20 PM Gender:Female Living status:Living Apgars:8 ,9  Weight:   Magnesium Sulfate received: No BMZ received: No Rhophylac:N/A MMR:No T-DaP: declined Flu: No RSV Vaccine received: No Transfusion:{Transfusion received:30440034} Immunizations administered:  There is no immunization history on file for this patient.  Physical exam  Vitals:   10/22/23 2248 10/22/23 2250 10/22/23 2335 10/22/23 2343  BP: 125/64  137/74 137/88  Pulse: 77  (!) 102 97  Resp:      Temp: 99.2 F (37.3 C)      TempSrc: Oral     SpO2:  91%    Weight:      Height:       General: {Exam; general:21111117} Lochia: {Desc; appropriate/inappropriate:30686::"appropriate"} Uterine Fundus: {Desc; firm/soft:30687} Incision: {Exam; incision:21111123} DVT Evaluation: {Exam; dvt:2111122} Labs: Lab Results  Component Value Date   WBC 7.6 10/22/2023   HGB 11.5 (L) 10/22/2023   HCT 34.8 (L) 10/22/2023   MCV 87.9 10/22/2023   PLT 280 10/22/2023      Latest Ref Rng & Units 10/22/2023    6:52 AM  CMP  Glucose 70 - 99 mg/dL 761   BUN 6 - 20 mg/dL 11   Creatinine 6.07 - 1.00 mg/dL 3.71   Sodium 062 - 694 mmol/L 134   Potassium 3.5 - 5.1 mmol/L 3.9   Chloride 98 - 111 mmol/L 104   CO2 22 - 32 mmol/L 22   Calcium 8.9 - 10.3 mg/dL 8.9   Total Protein 6.5 - 8.1 g/dL 6.7   Total Bilirubin 0.3 - 1.2 mg/dL 0.3   Alkaline Phos 38 - 126 U/L 96   AST 15 - 41 U/L 19   ALT 0 - 44 U/L 16    Edinburgh Score:    04/01/2023    1:29 PM  Edinburgh Postnatal Depression Scale Screening Tool  I have been able to laugh and see the funny side of things. 0  I have looked forward with enjoyment to things. 0  I have blamed myself unnecessarily when things went wrong. 0  I have been anxious or worried for  no good reason. 0  I have felt scared or panicky for no good reason. 0  Things have been getting on top of me. 0  I have been so unhappy that I have had difficulty sleeping. 0  I have felt sad or miserable. 0  I have been so unhappy that I have been crying. 0  The thought of harming myself has occurred to me. 0  Edinburgh Postnatal Depression Scale Total 0      After visit meds:  Allergies as of 10/22/2023   No Known Allergies   Med Rec must be completed prior to using this [facility]***        Discharge home in stable condition Infant Feeding: Bottle Infant Disposition:{CHL IP OB HOME WITH QVZDGL:87564} Discharge instruction: per After Visit Summary and Postpartum booklet. Activity: Advance as  tolerated. Pelvic rest for 6 weeks.  Diet: routine diet Anticipated Birth Control: {Birth Control:23956} Postpartum Appointment:6 weeks Additional Postpartum F/U: BP check 1 week Future Appointments:No future appointments. Follow up Visit:      10/22/2023 Lindalou Hose Alisha Morales, CNM

## 2023-10-22 NOTE — Progress Notes (Signed)
Labor Progress Note   ASSESSMENT/PLAN   Alisha Morales 38 y.o.   G3P1101  at [redacted]w[redacted]d here with induction due to [facility].  FWB:  - Fetal well being assessed: Cat I       GBS: - GBS positive, prophylaxis with PCN  LABOR: - Now with cervical ripening awaiting active labor, doing well. - Pain Management: epidural in place - Discussed options with patient and performed AROM to clear fluid with patient consent. Will start pitocin and titrate per policy.  - Anticipate SVD   Labor Progress 0715  0/th/high, Miso#1  0945  1/50/-3, Cook catheter placed 1208  Miso#2 1445  Cook catheter expelled, patient requesting epidural 1700  5/70/-2, AROM (clr)    SUBJECTIVE/OBJECTIVE   SUBJECTIVE:  Patient comfortable with epidural.    OBJECTIVE: Vital Signs: Patient Vitals for the past 12 hrs:  BP Temp Temp src Pulse Resp SpO2 Height Weight  10/22/23 1648 134/76 -- -- 66 -- -- -- --  10/22/23 1622 134/72 -- -- 75 -- -- -- --  10/22/23 1620 -- -- -- -- -- 99 % -- --  10/22/23 1607 132/71 -- -- 76 -- -- -- --  10/22/23 1552 127/71 -- -- 76 -- -- -- --  10/22/23 1537 128/73 -- -- 77 -- -- -- --  10/22/23 1535 -- -- -- -- -- 98 % -- --  10/22/23 1532 130/72 -- -- 75 -- -- -- --  10/22/23 1530 -- -- -- -- -- 99 % -- --  10/22/23 1527 130/75 -- -- 84 -- -- -- --  10/22/23 1525 -- -- -- -- -- 98 % -- --  10/22/23 1524 138/80 -- -- 76 -- -- -- --  10/22/23 1520 -- -- -- -- -- 100 % -- --  10/22/23 1517 (!) 140/82 -- -- 73 -- -- -- --  10/22/23 1515 (!) 149/85 -- -- 67 -- 96 % -- --  10/22/23 1510 -- -- -- -- -- 98 % -- --  10/22/23 1348 (!) 145/85 -- -- 69 -- -- -- --  10/22/23 1205 (!) 145/79 97.7 F (36.5 C) Oral 64 -- -- -- --  10/22/23 1044 119/70 -- -- 65 -- -- -- --  10/22/23 1030 118/69 -- -- 67 -- -- -- --  10/22/23 1014 112/71 -- -- 66 -- -- -- --  10/22/23 1004 107/63 -- -- 64 -- -- -- --  10/22/23 0719 (!) 145/87 97.9 F (36.6 C) Oral 71 17 -- -- --  10/22/23 0636 133/85 -- -- 74  -- -- -- --  10/22/23 0632 -- -- Oral -- 14 -- 5\' 2"  (1.575 m) 123.6 kg  10/22/23 0614 -- 99.2 F (37.3 C) Oral -- 14 -- -- --  10/22/23 0613 (!) 144/98 -- -- 88 -- -- -- --    Last SVE:  Dilation: 5 Effacement (%): 70 Cervical Position: Posterior Station: -2 Exam by:: Leatta Alewine, CNM -  ,  ,  ,    FHR:   - Mode: External  - Baseline Rate (A): 135 bpm  -    - Characteristics (ie - accels, decels): Accelerations: None  -    UTERINE ACTIVITY:   - Mode: Toco  - Contraction Frequency (min): 3-4 minutes

## 2023-10-23 LAB — CBC
HCT: 34.7 % — ABNORMAL LOW (ref 36.0–46.0)
Hemoglobin: 11.5 g/dL — ABNORMAL LOW (ref 12.0–15.0)
MCH: 29 pg (ref 26.0–34.0)
MCHC: 33.1 g/dL (ref 30.0–36.0)
MCV: 87.6 fL (ref 80.0–100.0)
Platelets: 274 10*3/uL (ref 150–400)
RBC: 3.96 MIL/uL (ref 3.87–5.11)
RDW: 14 % (ref 11.5–15.5)
WBC: 14 10*3/uL — ABNORMAL HIGH (ref 4.0–10.5)
nRBC: 0 % (ref 0.0–0.2)

## 2023-10-23 MED ORDER — ZOLPIDEM TARTRATE 5 MG PO TABS: 5.0000 mg | ORAL_TABLET | Freq: Every evening | ORAL | Status: DC | PRN

## 2023-10-23 MED ORDER — PRENATAL MULTIVITAMIN CH
1.0000 | ORAL_TABLET | Freq: Every day | ORAL | Status: DC
  Administered 2023-10-23 – 2023-10-24 (×2): 1 via ORAL
  Filled 2023-10-23 (×2): qty 1

## 2023-10-23 MED ORDER — DIPHENHYDRAMINE HCL 25 MG PO CAPS: 25.0000 mg | ORAL_CAPSULE | Freq: Four times a day (QID) | ORAL | Status: DC | PRN

## 2023-10-23 MED ORDER — NIFEDIPINE ER OSMOTIC RELEASE 30 MG PO TB24
60.0000 mg | ORAL_TABLET | Freq: Every day | ORAL | Status: DC
  Administered 2023-10-23 – 2023-10-24 (×2): 60 mg via ORAL
  Filled 2023-10-23 (×2): qty 2

## 2023-10-23 MED ORDER — ACETAMINOPHEN 325 MG PO TABS: 650.0000 mg | ORAL_TABLET | ORAL | Status: DC | PRN

## 2023-10-23 MED ORDER — IBUPROFEN 600 MG PO TABS
ORAL_TABLET | ORAL | Status: AC
  Filled 2023-10-23: qty 1

## 2023-10-23 MED ORDER — NIFEDIPINE ER OSMOTIC RELEASE 30 MG PO TB24
30.0000 mg | ORAL_TABLET | Freq: Every day | ORAL | Status: AC
  Administered 2023-10-23: 30 mg via ORAL

## 2023-10-23 MED ORDER — BENZOCAINE-MENTHOL 20-0.5 % EX AERO: 1.0000 | INHALATION_SPRAY | CUTANEOUS | Status: DC | PRN

## 2023-10-23 MED ORDER — OXYCODONE-ACETAMINOPHEN 5-325 MG PO TABS: 1.0000 | ORAL_TABLET | ORAL | Status: DC | PRN

## 2023-10-23 MED ORDER — NIFEDIPINE ER OSMOTIC RELEASE 30 MG PO TB24
ORAL_TABLET | ORAL | Status: AC
  Filled 2023-10-23: qty 1

## 2023-10-23 MED ORDER — SIMETHICONE 80 MG PO CHEW: 80.0000 mg | CHEWABLE_TABLET | ORAL | Status: DC | PRN

## 2023-10-23 MED ORDER — DOCUSATE SODIUM 100 MG PO CAPS
100.0000 mg | ORAL_CAPSULE | Freq: Two times a day (BID) | ORAL | Status: DC
  Administered 2023-10-23 – 2023-10-24 (×3): 100 mg via ORAL
  Filled 2023-10-23 (×3): qty 1

## 2023-10-23 MED ORDER — OXYTOCIN-SODIUM CHLORIDE 30-0.9 UT/500ML-% IV SOLN
2.5000 [IU]/h | INTRAVENOUS | Status: DC | PRN
Start: 2023-10-23 — End: 2023-10-24

## 2023-10-23 MED ORDER — IBUPROFEN 600 MG PO TABS
600.0000 mg | ORAL_TABLET | Freq: Four times a day (QID) | ORAL | Status: DC
  Administered 2023-10-23 – 2023-10-24 (×4): 600 mg via ORAL
  Filled 2023-10-23 (×3): qty 1

## 2023-10-23 NOTE — Anesthesia Postprocedure Evaluation (Signed)
Anesthesia Post Note  Patient: Dorien Chihuahua  Procedure(s) Performed: AN AD HOC LABOR EPIDURAL  Patient location during evaluation: Mother Baby Anesthesia Type: Epidural Level of consciousness: oriented and awake and alert Pain management: pain level controlled Vital Signs Assessment: post-procedure vital signs reviewed and stable Respiratory status: spontaneous breathing and respiratory function stable Cardiovascular status: blood pressure returned to baseline and stable Postop Assessment: no headache, no backache, no apparent nausea or vomiting and able to ambulate Anesthetic complications: no  No notable events documented.   Last Vitals:  Vitals:   10/23/23 0149 10/23/23 0306  BP: 121/61 (!) 147/77  Pulse: 69 64  Resp: 18 20  Temp: 37.2 C 36.8 C  SpO2: 97% 97%    Last Pain:  Vitals:   10/23/23 0306  TempSrc: Oral  PainSc:                  Starling Manns

## 2023-10-23 NOTE — Progress Notes (Signed)
Post Partum Day 1 Subjective: no complaints, up ad lib, voiding, tolerating PO, and + flatus Doing well overall. Holding infant while rounding, reports no s/s of pre-eclampsia.  Objective: Blood pressure (!) 127/91, pulse 81, temperature 98.5 F (36.9 C), temperature source Oral, resp. rate 20, height 5\' 2"  (1.575 m), weight 123.6 kg, last menstrual period 02/03/2023, SpO2 96%.  Physical Exam:  General: alert and cooperative Lochia: appropriate Uterine Fundus: firm Incision: n/a DVT Evaluation: No evidence of DVT seen on physical exam. Negative Homan's sign. No cords or calf tenderness. Some BLE edema noted; Niani mentions it has not gotten worse.   Recent Labs    10/22/23 0652 10/23/23 0604  HGB 11.5* 11.5*  HCT 34.8* 34.7*    Assessment/Plan: -Plan for discharge tomorrow pending BP review. -On 60 mg procardia XL daily; will continue to monitor BP today. Some mild range immediately after delivery and early this morning (150s/90s and 144/77) but have been normal since with the exception of 127/91 at 1126 this morning.  -Formula feeding infant.  -Continue postpartum orders.    LOS: 1 day   Mirna Mires, CNM 10/23/2023, 11:55 AM   Cindra Eves, SNM 10/23/2023 1155

## 2023-10-24 ENCOUNTER — Other Ambulatory Visit: Payer: Self-pay

## 2023-10-24 ENCOUNTER — Encounter: Payer: Self-pay | Admitting: Obstetrics

## 2023-10-24 MED ORDER — NIFEDIPINE ER OSMOTIC RELEASE 30 MG PO TB24
60.0000 mg | ORAL_TABLET | Freq: Every day | ORAL | 12 refills | Status: DC
  Filled 2023-10-24: qty 90, 23d supply, fill #0

## 2023-10-24 MED ORDER — NIFEDIPINE ER OSMOTIC RELEASE 30 MG PO TB24: 60.0000 mg | ORAL_TABLET | Freq: Every day | ORAL | 12 refills | Status: DC

## 2023-10-24 MED ORDER — INFLUENZA VIRUS VACC SPLIT PF (FLUZONE) 0.5 ML IM SUSY: 0.5000 mL | PREFILLED_SYRINGE | INTRAMUSCULAR | Status: DC

## 2023-10-24 MED ORDER — TETANUS-DIPHTH-ACELL PERTUSSIS 5-2.5-18.5 LF-MCG/0.5 IM SUSY: 0.5000 mL | PREFILLED_SYRINGE | Freq: Once | INTRAMUSCULAR | Status: AC

## 2023-10-24 NOTE — Final Progress Note (Signed)
Post Partum Day 2 Subjective: Alisha Morales is feeling well overall. She is ambulating, voiding, and tolerating POs without difficulty. Her pain is well-controlled and her bleeding is WNL. She denies HA, visual changes, and epigastric pain. Her mood is stable. Bottle feeding is going well.   Objective: Blood pressure 110/66, pulse 66, temperature 97.8 F (36.6 C), temperature source Oral, resp. rate 16, height 5\' 2"  (1.575 m), weight 123.6 kg, last menstrual period 02/03/2023, SpO2 97%.  Physical Exam:  General: alert and cooperative Cardiac: RRR Lungs: CTAB Abdomen: soft, non-tender, normal BS Lochia: appropriate Uterine Fundus: firm Incision: N/A DVT Evaluation: No evidence of DVT seen on physical exam. 1+/2+ pitting edema in feet and ankles  Recent Labs    10/22/23 0652 10/23/23 0604  HGB 11.5* 11.5*  HCT 34.8* 34.7*    Assessment/Plan: Discharge home Discharge instructions reviewed BP check in one week Video visit in 2 weeks Office visit in 6 weeks Plans oral contraceptives  LOS: 2 days   Glenetta Borg, CNM 10/24/2023, 10:50 AM

## 2023-10-24 NOTE — Progress Notes (Signed)
Pt discharged with infant.  Discharge instructions, prescriptions and follow up appointment given to and reviewed with pt. Pt verbalized understanding. Escorted out by auxillary. 

## 2023-11-01 ENCOUNTER — Other Ambulatory Visit: Payer: Self-pay

## 2023-11-01 ENCOUNTER — Ambulatory Visit (INDEPENDENT_AMBULATORY_CARE_PROVIDER_SITE_OTHER): Payer: BC Managed Care – PPO | Admitting: Obstetrics and Gynecology

## 2023-11-01 ENCOUNTER — Observation Stay
Admission: AD | Admit: 2023-11-01 | Discharge: 2023-11-01 | Disposition: A | Discharge status: Home / Self Care | Payer: BC Managed Care – PPO | Source: Ambulatory Visit | Attending: Certified Nurse Midwife | Admitting: Certified Nurse Midwife

## 2023-11-01 VITALS — BP 160/101 | HR 90 | Resp 16 | Ht 63.0 in | Wt 257.9 lb

## 2023-11-01 DIAGNOSIS — O10913 Unspecified pre-existing hypertension complicating pregnancy, third trimester: Principal | ICD-10-CM | POA: Insufficient documentation

## 2023-11-01 DIAGNOSIS — Z0130 Encounter for examination of blood pressure without abnormal findings: Secondary | ICD-10-CM

## 2023-11-01 DIAGNOSIS — O09523 Supervision of elderly multigravida, third trimester: Secondary | ICD-10-CM | POA: Diagnosis not present

## 2023-11-01 DIAGNOSIS — O1093 Unspecified pre-existing hypertension complicating the puerperium: Secondary | ICD-10-CM | POA: Diagnosis present

## 2023-11-01 DIAGNOSIS — Z7982 Long term (current) use of aspirin: Secondary | ICD-10-CM | POA: Insufficient documentation

## 2023-11-01 DIAGNOSIS — Z3A00 Weeks of gestation of pregnancy not specified: Secondary | ICD-10-CM | POA: Diagnosis not present

## 2023-11-01 DIAGNOSIS — R519 Headache, unspecified: Secondary | ICD-10-CM | POA: Insufficient documentation

## 2023-11-01 LAB — COMPREHENSIVE METABOLIC PANEL
ALT: 27 U/L (ref 0–44)
AST: 17 U/L (ref 15–41)
Albumin: 3 g/dL — ABNORMAL LOW (ref 3.5–5.0)
Alkaline Phosphatase: 72 U/L (ref 38–126)
Anion gap: 9 (ref 5–15)
BUN: 10 mg/dL (ref 6–20)
CO2: 25 mmol/L (ref 22–32)
Calcium: 8.6 mg/dL — ABNORMAL LOW (ref 8.9–10.3)
Chloride: 104 mmol/L (ref 98–111)
Creatinine, Ser: 0.59 mg/dL (ref 0.44–1.00)
GFR, Estimated: >60 mL/min (ref >60)
Glucose, Bld: 92 mg/dL (ref 70–99)
Potassium: 3.5 mmol/L (ref 3.5–5.1)
Sodium: 138 mmol/L (ref 135–145)
Total Bilirubin: 0.6 mg/dL (ref 0.3–1.2)
Total Protein: 6.9 g/dL (ref 6.5–8.1)

## 2023-11-01 LAB — CBC
HCT: 35.8 % — ABNORMAL LOW (ref 36.0–46.0)
Hemoglobin: 11.7 g/dL — ABNORMAL LOW (ref 12.0–15.0)
MCH: 29 pg (ref 26.0–34.0)
MCHC: 32.7 g/dL (ref 30.0–36.0)
MCV: 88.6 fL (ref 80.0–100.0)
Platelets: 316 10*3/uL (ref 150–400)
RBC: 4.04 MIL/uL (ref 3.87–5.11)
RDW: 13.9 % (ref 11.5–15.5)
WBC: 5.1 10*3/uL (ref 4.0–10.5)
nRBC: 0 % (ref 0.0–0.2)

## 2023-11-01 LAB — PROTEIN / CREATININE RATIO, URINE
Creatinine, Urine: 60 mg/dL
Creatinine, Urine: 29 mg/dL
Protein Creatinine Ratio: 0.63 mg/mg{creat} — ABNORMAL HIGH (ref 0.00–0.15)
Total Protein, Urine: 38 mg/dL
Total Protein, Urine: <6 mg/dL

## 2023-11-01 MED ORDER — NIFEDIPINE ER OSMOTIC RELEASE 60 MG PO TB24: 60.0000 mg | ORAL_TABLET | Freq: Every morning | ORAL | 11 refills | Status: AC

## 2023-11-01 MED ORDER — NIFEDIPINE ER OSMOTIC RELEASE 30 MG PO TB24
30.0000 mg | ORAL_TABLET | Freq: Once | ORAL | Status: AC
  Administered 2023-11-01: 30 mg via ORAL
  Filled 2023-11-01: qty 1

## 2023-11-01 MED ORDER — NIFEDIPINE ER OSMOTIC RELEASE 30 MG PO TB24: 30.0000 mg | ORAL_TABLET | Freq: Every evening | ORAL | 4 refills | Status: AC

## 2023-11-01 MED ORDER — ACETAMINOPHEN 500 MG PO TABS
1000.0000 mg | ORAL_TABLET | Freq: Four times a day (QID) | ORAL | Status: DC | PRN
  Administered 2023-11-01: 1000 mg via ORAL
  Filled 2023-11-01: qty 2

## 2023-11-01 NOTE — Progress Notes (Signed)
Pt presents to L/D triage from office after elevated Bps in office.  Initial BP 149/93- cycling q15 in. Pt reports intermittent headache- no other PIH or postpartum complaints. +2 reflexes, no clonus, vision changes, or epigastric pain. +2 pedal edema- pt states this is not sudden and began before childbirth.  60 mg procardia xl taken this morning as prescribed.  Labs pending. CNM notified.

## 2023-11-01 NOTE — Patient Instructions (Signed)
Postpartum Hypertension Postpartum hypertension is blood pressure that is higher than normal after childbirth. It usually starts within 1 to 2 days after delivery, but it can happen at any time for up to 12 weeks after delivery. For some women, medical treatment is required to prevent serious complications, such as seizures or stroke. What are the causes? The cause of this condition is not well understood. In some cases, the cause may not be known. Certain conditions may increase your risk. These include: Hypertension that existed before pregnancy (chronic hypertension). Hypertension that happens as a result of pregnancy (gestational hypertension). Hypertensive disorders during pregnancy or seizures in women who have high blood pressure during pregnancy. These conditions are called preeclampsia and eclampsia. A condition in which the liver, platelets, and red blood cells are damaged during pregnancy (HELLP syndrome). Obesity. Diabetes. What are the signs or symptoms? Signs and symptoms may include: Headaches. These may be mild, moderate, or severe. They may also be steady, constant, or sudden (thunderclap headache). Vision changes, such as blurry vision, flashing lights, or seeing spots. Nausea and vomiting. Pain in the upper right side of your abdomen. Shortness of breath or trouble breathing. Swelling in your face or hands. A decrease in the amount of urine that you pass. You may not have any signs or symptoms. How is this diagnosed? This condition may be diagnosed based on the results of a physical exam, blood pressure measurements, and blood and urine tests. You may also have other tests, such as a CT scan or an MRI, to check for other problems. How is this treated? If blood pressure is high enough to require treatment, your options may include: Medicines to reduce blood pressure (antihypertensives). Tell your health care provider if you are breastfeeding or if you plan to breastfeed.  There are many antihypertensive medicines that are safe to take while breastfeeding. Treating medical conditions that are causing hypertension. Treating the complications of hypertension, such as seizures, stroke, or kidney problems. Your health care provider will also continue to monitor your blood pressure closely until it is within a safe range for you. Follow these instructions at home: Learn your goal blood pressure Two numbers make up your blood pressure. The first number is called systolic pressure. The second is called diastolic pressure. An example of a blood pressure reading is "120 over 80" (or 120/80). Ask your health care provider what your goal blood pressure is. Know how to take your blood pressure To check your blood pressure, follow the instructions in the manual that came with your blood pressure monitor. This includes any instructions on what to do before taking your blood pressure. Record your blood pressure readings Follow your health care provider's instructions on how to record your blood pressure readings. Your health care provider may ask you to: Get one reading in the morning (a.m.) before you take any medicines. Get one reading in the evening (p.m.) before supper. Take at least 2 readings with each blood pressure check. This makes sure the results are correct. Wait 1-2 minutes between measurements.  General instructions Take over-the-counter and prescription medicines only as told by your health care provider. Do not use any products that contain nicotine or tobacco. These products include cigarettes, chewing tobacco, and vaping devices, such as e-cigarettes. If you need help quitting, ask your health care provider. Check your blood pressure as often as told by your health care provider. Return to your normal activities as told by your health care provider. Ask your health care provider  what activities are safe for you. Keep all follow-up visits. Your health care  provider will continue to monitor your blood pressure closely until it is in a safe range for you. Contact a health care provider if: You have new symptoms, such as: A headache that does not get better. Dizziness. Vision changes. Nausea and vomiting. Get help right away if: You have trouble breathing. You have chest pain. You faint. You have any symptoms of a stroke. "BE FAST" is an easy way to remember the main warning signs of a stroke: B - Balance. Signs are dizziness, sudden trouble walking, or loss of balance. E - Eyes. Signs are trouble seeing or a sudden change in vision. F - Face. Signs are sudden weakness or numbness of the face, or the face or eyelid drooping on one side. A - Arms. Signs are weakness or numbness in an arm. This happens suddenly and usually on one side of the body. S - Speech. Signs are sudden trouble speaking, slurred speech, or trouble understanding what people say. T - Time. Time to call emergency services. Write down what time symptoms started. You have other signs of a stroke, such as: A sudden, severe headache with no known cause. Nausea or vomiting. Seizure. These symptoms may be an emergency. Get help right away. Call 911. Do not wait to see if the symptoms will go away. Do not drive yourself to the hospital. Summary Postpartum hypertension is blood pressure that is higher than normal after childbirth. For some women, medical treatment is required to prevent serious complications, such as seizures or stroke. Follow your health care provider's instructions on how to record your blood pressure readings. Keep all follow-up visits. Your health care provider will continue to monitor your blood pressure closely until it is within a safe range for you. This information is not intended to replace advice given to you by your health care provider. Make sure you discuss any questions you have with your health care provider. Document Revised: 03/20/2022 Document  Reviewed: 03/20/2022 Elsevier Patient Education  2024 [employer].

## 2023-11-01 NOTE — Progress Notes (Signed)
Pt discharged home per order.   Pt stable and ambulatory and an After Visit Summary was printed and given to the patient. Discharge education completed with patient/family including follow up instructions, appointments, and medication list. Pt received hypertension precautions and med adjustment instructions.   Patient able to verbalize understanding, all questions fully answered upon discharge. Pt discharged home via personal vehicle with infant.  Pt to return to office for BP check Tuesday.

## 2023-11-01 NOTE — Progress Notes (Signed)
   Subjective:    Patient ID: Alisha Morales, female    DOB: 1985/11/21, 38 y.o.   MRN: [id number]  HPI patient had chronic hypertension during her pregnancy and underwent an induction at 37 weeks.  Delivery occurred on 1022.  She presents today to the office for a blood pressure check.  She states that she has been taking her 30 mg of Procardia daily.  She has had some headaches and swelling over the last few days but does not currently have a headache.    Review of Systems     Objective:   Physical Exam Her blood pressures today in the office were 179/114 and a repeat was 160/101.        Assessment & Plan:   Likely continuation of chronic hypertension not well treated with Procardia.  Must rule out postpartum preeclampsia. I have discussed this with the patient and we will send her to labor and delivery for frequent blood pressure checks and control of her hypertension as well as to rule out postpartum preeclampsia. Doreene Burke notified.

## 2023-11-01 NOTE — OB Triage Provider Note (Signed)
L&D OB Triage Note  SUBJECTIVE Alisha Morales is a 38 y.o. G27P1101 female status post SVD on 10/22/23 . She has history of chronic hypertension. She was sent from the office for blood pressure monitoring. She was seen in clinic today for blood pressure check and had Bps that were severe range 179/114 repeat 160/101 (See clinic note). She denies epigastric pain and visual changes. She has some swelling that has not changed for her since before delivery. She has a headache that she has not treated with any medication.   OB History  Gravida Para Term Preterm AB Living  3 2 1 1  0 1  SAB IAB Ectopic Multiple Live Births  0 0 0 0 1    # Outcome Date GA Lbr Len/2nd Weight Sex Type Anes PTL Lv  3 Gravida           2 Term 08/08/09 [redacted]w[redacted]d  3827 g M Vag-Spont  N LIV  1 Preterm 2005 [redacted]w[redacted]d    Vag-Spont   FD     Birth Comments: TWINS    Medications Prior to Admission  Medication Sig Dispense Refill Last Dose   aspirin 81 MG chewable tablet Chew 81 mg by mouth daily.   11/01/2023   NIFEdipine (PROCARDIA-XL/NIFEDICAL-XL) 30 MG 24 hr tablet Take 2 tablets (60 mg total) by mouth once daily. (Can increase to twice a day as needed for symptomatic contractions) 90 tablet 12 11/01/2023   Prenatal Vit-Fe Fumarate-FA (MULTIVITAMIN-PRENATAL) 27-0.8 MG TABS tablet Take 1 tablet by mouth daily at 12 noon. (Patient not taking: Reported on 11/01/2023)   Completed Course     OBJECTIVE  Nursing Evaluation:   BP (!) 150/82   Pulse 69   Temp 98.4 F (36.9 C) (Oral)   Resp 18   Ht 5\' 3"  (1.6 m)   Wt 116.6 kg   LMP 02/03/2023 (Exact Date)   BMI 45.53 kg/m   CBC    Component Value Date/Time   WBC 5.1 11/01/2023 1047   RBC 4.04 11/01/2023 1047   HGB 11.7 (L) 11/01/2023 1047   HGB 11.0 (L) 09/16/2023 1200   HCT 35.8 (L) 11/01/2023 1047   HCT 33.2 (L) 09/16/2023 1200   PLT 316 11/01/2023 1047   PLT 274 09/16/2023 1200   MCV 88.6 11/01/2023 1047   MCV 88 09/16/2023 1200   MCH 29.0 11/01/2023 1047    MCHC 32.7 11/01/2023 1047   RDW 13.9 11/01/2023 1047   RDW 12.4 09/16/2023 1200   LYMPHSABS 1.2 08/12/2023 1028   EOSABS 0.1 08/12/2023 1028   BASOSABS 0.0 08/12/2023 1028      Latest Ref Rng & Units 11/01/2023   10:47 AM 10/22/2023    6:52 AM 09/24/2023    2:32 PM  CMP  Glucose 70 - 99 mg/dL 92  161  96   BUN 6 - 20 mg/dL 10  11  10    Creatinine 0.44 - 1.00 mg/dL 0.96  0.45  4.09   Sodium 135 - 145 mmol/L 138  134  137   Potassium 3.5 - 5.1 mmol/L 3.5  3.9  4.5   Chloride 98 - 111 mmol/L 104  104  105   CO2 22 - 32 mmol/L 25  22  25    Calcium 8.9 - 10.3 mg/dL 8.6  8.9  9.2   Total Protein 6.5 - 8.1 g/dL 6.9  6.7  6.4   Total Bilirubin 0.3 - 1.2 mg/dL 0.6  0.3  0.2   Alkaline  Phos 38 - 126 U/L 72  96  71   AST 15 - 41 U/L 17  19  18    ALT 0 - 44 U/L 27  16  18            P/c ratio: first urine done as clean catch ( ordered and done by RN) was elevated , repeat specimen with sterile cath: results below reportable range.   Headache has resolved with tylenol.  Normal reflexes, no clonus per RN exam    ASSESSMENT Impression:  1.  Chronic hypertension not well controlled with current procardia xl 60 mg daily 2.  Negative for postpartum pre eclampsia   PLAN 1. Consulted Dr. Lonny Prude regarding plan of care. Increase procardia xL 60 mg in am  add 30 mg pm.  2. Reviewed signs and symptoms of pre eclampsia 3. Follow up in office Tuesday for BP check  Doreene Burke, CNM

## 2023-11-01 NOTE — Progress Notes (Signed)
    NURSE VISIT NOTE  Subjective:    Patient ID: Alisha Morales, female    DOB: 20-Jun-1985, 38 y.o.   MRN: [id number]  HPI  Patient is a 38 y.o. G43P1101 female who presents for BP check per order from Guadlupe Spanish, CNM. She has headaches and swelling in her lower extremities.  Patient reports compliance with prescribed BP medications: yes Procardia 30 mg twice daily Last dose of BP medication:  this morning at 8:45 am  BP Readings from Last 3 Encounters:  11/01/23 (!) 160/101  10/24/23 130/88  10/21/23 (!) 147/101   Pulse Readings from Last 3 Encounters:  10/24/23 76  10/21/23 78  10/21/23 73    Objective:    LMP 02/03/2023 (Exact Date)   Assessment:   1. Blood pressure check      Plan:   Per Dr. Guadlupe Spanish, CNM:  Patient was sent to be evaluated by Dr. Logan Bores to discuss treatment plan.  Patient verbalized understanding of instructions.      Santiago Bumpers, CMA [facility]

## 2023-11-06 ENCOUNTER — Encounter: Payer: Self-pay | Admitting: Obstetrics

## 2023-11-06 ENCOUNTER — Ambulatory Visit (INDEPENDENT_AMBULATORY_CARE_PROVIDER_SITE_OTHER): Payer: BC Managed Care – PPO | Admitting: Obstetrics

## 2023-11-06 ENCOUNTER — Ambulatory Visit: Payer: BC Managed Care – PPO | Admitting: Obstetrics

## 2023-11-06 NOTE — Progress Notes (Signed)
   Postpartum Visit    Subjective:    Subjective  Alisha Morales is a 38 y.o. female who presents for a postpartum visit. She is2 weeks  postpartum following a spontaneous vaginal delivery. I have fully reviewed the prenatal and intrapartum course. The delivery was at [redacted]w[redacted]d .  Anesthesia: epidural.    Postpartum course has been complicated by elevated blood pressures. She was sent to triage at approximately one week PP for blood pressure management, and her dose of Procardia was increased at that time. She has no pain and no other concerns. Baby's course has been normal. Baby is feeding by   bottle but wants to try to induce lactation with a breast pump . Bleeding  is minimal . Bowel movements are normal consistency but less frequent. Bladder function is normal.  .  Contraception method: oral progesterone-only contraceptive. Postpartum depression screening: Negative. EPDS 0     Review of Systems Pertinent positives noted in HPI. Remainder of comprehensive ROS otherwise negative.     Objective:      Objective [] Expand by Default BP 131/88   Ht 5\' 3"  (1.6 m)   Wt 255 lb (115.7 kg)   LMP 02/03/2023 (Exact Date)   Breastfeeding No   BMI 45.17 kg/m  t.  General:  alert, cooperative, and no distress        Assessment:    Normal postpartum course, mood stable. CHTN, now controlled on Nifedipine XL 90 mg daily   Plan:      Plan -Anticipatory guidance about the postpartum period -Reviewed s/s of PPD and PPA -Discussed when to seek medical care, s/s of severe hypertension -Office visit at 6 weeks postpartum        Glenetta Borg, CNM

## 2023-12-02 ENCOUNTER — Encounter: Payer: Self-pay | Admitting: Obstetrics

## 2023-12-02 ENCOUNTER — Other Ambulatory Visit: Payer: Self-pay | Admitting: Obstetrics

## 2023-12-02 ENCOUNTER — Ambulatory Visit (INDEPENDENT_AMBULATORY_CARE_PROVIDER_SITE_OTHER): Payer: BC Managed Care – PPO | Admitting: Obstetrics

## 2023-12-02 DIAGNOSIS — I10 Essential (primary) hypertension: Secondary | ICD-10-CM | POA: Insufficient documentation

## 2023-12-02 MED ORDER — NORETHINDRONE 0.35 MG PO TABS: 1.0000 | ORAL_TABLET | Freq: Every day | ORAL | 11 refills | Status: DC

## 2023-12-02 MED ORDER — NORETHINDRONE 0.35 MG PO TABS
1.0000 | ORAL_TABLET | Freq: Every day | ORAL | 3 refills | Status: AC
Start: 2023-12-02 — End: ?

## 2023-12-02 NOTE — Progress Notes (Signed)
Post Partum Visit Note  Alisha Morales is a 38 y.o. G53P1101 female who presents for a postpartum visit. She is 6 weeks postpartum following a normal spontaneous vaginal delivery.  I have fully reviewed the prenatal and intrapartum course. The delivery was at [redacted]w[redacted]d.  Anesthesia: epidural. Postpartum course has been complicated by hypertension. She has been taking Procardia XL 90 mg daily. She has been running low on pills, and has noticed her BP is elevated and she has had a headache. Even when she is on her appropriate dose, her pressures remain 140s/90s. Baby is doing well and feeding by bottle. Bleeding: period has started. Bowel function is normal. Bladder function is normal. Patient is not sexually active. Contraception method is oral progesterone-only contraceptive. Postpartum depression screening: negative.   The pregnancy intention screening data noted above was reviewed. Potential methods of contraception were discussed. The patient elected to proceed with No data recorded.    Health Maintenance Due  Topic Date Due   INFLUENZA VACCINE  Never done   COVID-19 Vaccine (3 - 2023-24 season) 09/01/2023    The following portions of the patient's history were reviewed and updated as appropriate: allergies, current medications, past medical history, past surgical history, and problem list.  Review of Systems Pertinent items are noted in HPI.  Objective:  BP (!) 157/108   Pulse 81   Ht 5\' 3"  (1.6 m)   Wt 255 lb (115.7 kg)   LMP 11/30/2023 (Approximate)   Breastfeeding No   BMI 45.17 kg/m    General:  alert, cooperative, and appears stated age   Breasts:  not indicated  Lungs: clear to auscultation bilaterally  Heart:  regular rate and rhythm, S1, S2 normal, no murmur, click, rub or gallop  Abdomen: soft, non-tender; bowel sounds normal; no masses,  no organomegaly   Wound:  N/A  GU exam:  not indicated       Assessment:   Chronic Hypertension  6-week  postpartum  exam.   Plan:   Essential components of care per ACOG recommendations:  1.  Mood and well being: Patient with negative depression screening today. Reviewed local resources for support.  - Patient tobacco use? No.   - hx of drug use? No.    2. Infant care and feeding:  -Patient currently breastmilk feeding? No.  -Social determinants of health (SDOH) reviewed in EPIC. No concerns  3. Sexuality, contraception and birth spacing - Patient does not want a pregnancy in the next year.  Desired family size is 2 children.  - Reviewed reproductive life planning. Reviewed contraceptive methods based on pt preferences and effectiveness.  Patient desired Oral Contraceptive today.   - Discussed birth spacing of 18 months  4. Sleep and fatigue -Encouraged family/partner/community support of 4 hrs of uninterrupted sleep to help with mood and fatigue  5. Physical Recovery   - Patient had a  NSVD . Patient did not have a laceration. Perineal healing reviewed. Patient expressed understanding - Patient has urinary incontinence? No. - Patient is safe to resume physical and sexual activity  6.  Health Maintenance - HM due items addressed Yes - Last pap smear  Diagnosis  Date Value Ref Range Status  04/29/2023   Final   - Negative for intraepithelial lesion or malignancy (NILM)   Pap smear not done at today's visit.  -Breast Cancer screening indicated? No.   7. Chronic Disease/Pregnancy Condition follow up: Hypertension -Consulted with Dr. Lonny Prude. She recommends continuing current dose of Procardia. Return  for BP check in one week and schedule a visit with PCP for HTN management.  - PCP follow up  Glenetta Borg, CNM [facility]

## 2023-12-03 LAB — COMPREHENSIVE METABOLIC PANEL
ALT: 28 [IU]/L (ref 0–32)
AST: 19 [IU]/L (ref 0–40)
Albumin: 3.8 g/dL — ABNORMAL LOW (ref 3.9–4.9)
Alkaline Phosphatase: 93 [IU]/L (ref 44–121)
BUN/Creatinine Ratio: 8 — ABNORMAL LOW (ref 9–23)
BUN: 6 mg/dL (ref 6–20)
Bilirubin Total: 0.3 mg/dL (ref 0.0–1.2)
CO2: 26 mmol/L (ref 20–29)
Calcium: 9 mg/dL (ref 8.7–10.2)
Chloride: 101 mmol/L (ref 96–106)
Creatinine, Ser: 0.79 mg/dL (ref 0.57–1.00)
Globulin, Total: 3.6 g/dL (ref 1.5–4.5)
Glucose: 91 mg/dL (ref 70–99)
Potassium: 4 mmol/L (ref 3.5–5.2)
Sodium: 141 mmol/L (ref 134–144)
Total Protein: 7.4 g/dL (ref 6.0–8.5)
eGFR: 98 mL/min/{1.73_m2} (ref >59)

## 2023-12-03 LAB — CBC
Hematocrit: 37.5 % (ref 34.0–46.6)
Hemoglobin: 12 g/dL (ref 11.1–15.9)
MCH: 28.1 pg (ref 26.6–33.0)
MCHC: 32 g/dL (ref 31.5–35.7)
MCV: 88 fL (ref 79–97)
Platelets: 303 10*3/uL (ref 150–450)
RBC: 4.27 x10E6/uL (ref 3.77–5.28)
RDW: 13.5 % (ref 11.7–15.4)
WBC: 5.4 10*3/uL (ref 3.4–10.8)

## 2023-12-04 LAB — PROTEIN / CREATININE RATIO, URINE
Creatinine, Urine: 33.2 mg/dL
Protein, Ur: 7 mg/dL
Protein/Creat Ratio: 211 mg/g{creat} — ABNORMAL HIGH (ref 0–200)

## 2023-12-05 ENCOUNTER — Encounter: Payer: Self-pay | Admitting: Obstetrics

## 2023-12-09 ENCOUNTER — Ambulatory Visit (INDEPENDENT_AMBULATORY_CARE_PROVIDER_SITE_OTHER): Payer: BC Managed Care – PPO

## 2023-12-09 VITALS — BP 139/94 | HR 92 | Wt 264.1 lb

## 2023-12-09 DIAGNOSIS — Z0130 Encounter for examination of blood pressure without abnormal findings: Secondary | ICD-10-CM

## 2023-12-09 DIAGNOSIS — I10 Essential (primary) hypertension: Secondary | ICD-10-CM

## 2023-12-09 NOTE — Patient Instructions (Signed)
Managing Your Hypertension Hypertension, also called high blood pressure, is when the force of the blood pressing against the walls of the arteries is too strong. Arteries are blood vessels that carry blood from your heart throughout your body. Hypertension forces the heart to work harder to pump blood and may cause the arteries to become narrow or stiff. Understanding blood pressure readings A blood pressure reading includes a higher number over a lower number: The first, or top, number is called the systolic pressure. It is a measure of the pressure in your arteries as your heart beats. The second, or bottom number, is called the diastolic pressure. It is a measure of the pressure in your arteries as the heart relaxes. For most people, a normal blood pressure is below 120/80. Your personal target blood pressure may vary depending on your medical conditions, your age, and other factors. Blood pressure is classified into four stages. Based on your blood pressure reading, your health care provider may use the following stages to determine what type of treatment you need, if any. Systolic pressure and diastolic pressure are measured in a unit called millimeters of mercury (mmHg). Normal Systolic pressure: below 120. Diastolic pressure: below 80. Elevated Systolic pressure: 120-129. Diastolic pressure: below 80. Hypertension stage 1 Systolic pressure: 130-139. Diastolic pressure: 80-89. Hypertension stage 2 Systolic pressure: 140 or above. Diastolic pressure: 90 or above. How can this condition affect me? Managing your hypertension is very important. Over time, hypertension can damage the arteries and decrease blood flow to parts of the body, including the brain, heart, and kidneys. Having untreated or uncontrolled hypertension can lead to: A heart attack. A stroke. A weakened blood vessel (aneurysm). Heart failure. Kidney damage. Eye damage. Memory and concentration problems. Vascular  dementia. What actions can I take to manage this condition? Hypertension can be managed by making lifestyle changes and possibly by taking medicines. Your health care provider will help you make a plan to bring your blood pressure within a normal range. You may be referred for counseling on a healthy diet and physical activity. Nutrition  Eat a diet that is high in fiber and potassium, and low in salt (sodium), added sugar, and fat. An example eating plan is called the DASH diet. DASH stands for Dietary Approaches to Stop Hypertension. To eat this way: Eat plenty of fresh fruits and vegetables. Try to fill one-half of your plate at each meal with fruits and vegetables. Eat whole grains, such as whole-wheat pasta, brown rice, or whole-grain bread. Fill about one-fourth of your plate with whole grains. Eat low-fat dairy products. Avoid fatty cuts of meat, processed or cured meats, and poultry with skin. Fill about one-fourth of your plate with lean proteins such as fish, chicken without skin, beans, eggs, and tofu. Avoid pre-made and processed foods. These tend to be higher in sodium, added sugar, and fat. Reduce your daily sodium intake. Many people with hypertension should eat less than 1,500 mg of sodium a day. Lifestyle  Work with your health care provider to maintain a healthy body weight or to lose weight. Ask what an ideal weight is for you. Get at least 30 minutes of exercise that causes your heart to beat faster (aerobic exercise) most days of the week. Activities may include walking, swimming, or biking. Include exercise to strengthen your muscles (resistance exercise), such as weight lifting, as part of your weekly exercise routine. Try to do these types of exercises for 30 minutes at least 3 days a week. Do   not use any products that contain nicotine or tobacco. These products include cigarettes, chewing tobacco, and vaping devices, such as e-cigarettes. If you need help quitting, ask your  health care provider. Control any long-term (chronic) conditions you have, such as high cholesterol or diabetes. Identify your sources of stress and find ways to manage stress. This may include meditation, deep breathing, or making time for fun activities. Alcohol use Do not drink alcohol if: Your health care provider tells you not to drink. You are pregnant, may be pregnant, or are planning to become pregnant. If you drink alcohol: Limit how much you have to: 0-1 drink a day for women. 0-2 drinks a day for men. Know how much alcohol is in your drink. In the [place] one drink equals one 12 oz bottle of beer (355 mL), one 5 oz glass of wine (148 mL), or one 1 oz glass of hard liquor (44 mL). Medicines Your health care provider may prescribe medicine if lifestyle changes are not enough to get your blood pressure under control and if: Your systolic blood pressure is 130 or higher. Your diastolic blood pressure is 80 or higher. Take medicines only as told by your health care provider. Follow the directions carefully. Blood pressure medicines must be taken as told by your health care provider. The medicine does not work as well when you skip doses. Skipping doses also puts you at risk for problems. Monitoring Before you monitor your blood pressure: Do not smoke, drink caffeinated beverages, or exercise within 30 minutes before taking a measurement. Use the bathroom and empty your bladder (urinate). Sit quietly for at least 5 minutes before taking measurements. Monitor your blood pressure at home as told by your health care provider. To do this: Sit with your back straight and supported. Place your feet flat on the floor. Do not cross your legs. Support your arm on a flat surface, such as a table. Make sure your upper arm is at heart level. Each time you measure, take two or three readings one minute apart and record the results. You may also need to have your blood pressure checked regularly by  your health care provider. General information Talk with your health care provider about your diet, exercise habits, and other lifestyle factors that may be contributing to hypertension. Review all the medicines you take with your health care provider because there may be side effects or interactions. Keep all follow-up visits. Your health care provider can help you create and adjust your plan for managing your high blood pressure. Where to find more information National Heart, Lung, and Blood Institute: www.nhlbi.nih.gov American Heart Association: www.heart.org Contact a health care provider if: You think you are having a reaction to medicines you have taken. You have repeated (recurrent) headaches. You feel dizzy. You have swelling in your ankles. You have trouble with your vision. Get help right away if: You develop a severe headache or confusion. You have unusual weakness or numbness, or you feel faint. You have severe pain in your chest or abdomen. You vomit repeatedly. You have trouble breathing. These symptoms may be an emergency. Get help right away. Call 911. Do not wait to see if the symptoms will go away. Do not drive yourself to the hospital. Summary Hypertension is when the force of blood pumping through your arteries is too strong. If this condition is not controlled, it may put you at risk for serious complications. Your personal target blood pressure may vary depending on your medical conditions,   your age, and other factors. For most people, a normal blood pressure is less than 120/80. Hypertension is managed by lifestyle changes, medicines, or both. Lifestyle changes to help manage hypertension include losing weight, eating a healthy, low-sodium diet, exercising more, stopping smoking, and limiting alcohol. This information is not intended to replace advice given to you by your health care provider. Make sure you discuss any questions you have with your health care  provider. Document Revised: 08/31/2021 Document Reviewed: 08/31/2021 Elsevier Patient Education  2024 Elsevier Inc.  

## 2023-12-09 NOTE — Progress Notes (Signed)
    NURSE VISIT NOTE  Subjective:    Patient ID: Alisha Morales, female    DOB: 1985/08/19, 38 y.o.   MRN: [id number]  HPI  Patient is a 38 y.o. G15P1101 female who presents for BP check per order from Guadlupe Spanish, CNM.   Patient reports compliance with prescribed BP medications: yes Procardia 30 mg and Procardia 60 mg , procardia 30mg  in the evening and 60mg  in the morning.  Last dose of BP medication:  12/09/23 at 7:30AM  BP Readings from Last 3 Encounters:  12/09/23 (!) 139/94  12/02/23 (!) 158/103  11/06/23 131/88   Pulse Readings from Last 3 Encounters:  12/09/23 92  12/02/23 81  11/01/23 65    Objective:    BP (!) 139/94   Pulse 92   Wt 264 lb 1.6 oz (119.8 kg)   LMP 11/30/2023 (Approximate)   Breastfeeding No   BMI 46.78 kg/m   Assessment:   No diagnosis found.   Plan:   Per Dr. Autumn Messing, CNM:  Continue current treatment regimen. Continue to monitor blood pressure at home. Report any reading >140/90 or with any associated symptoms. Return to clinic as scheduled. Contact your PCP to schedule a visit to address Hypertension,   Patient verbalized understanding of instructions.   Fonda Kinder, CMA

## 2024-01-24 ENCOUNTER — Telehealth: Payer: Self-pay

## 2024-01-24 NOTE — Progress Notes (Signed)
..   Medicaid Managed Care   Unsuccessful Outreach Note  01/24/2024 Name: Alisha Morales MRN: [id number] DOB: 02-19-1985  Referred by: Nira Retort Reason for referral : High Risk Managed Medicaid (I attempted to reach the patient today to get her scheduled with the MM team. She did not answer and her VM was full.)   An unsuccessful telephone outreach was attempted today. The patient was referred to the case management team for assistance with care management and care coordination.   Follow Up Plan: The care management team will reach out to the patient again over the next 7 days.   Weston Settle [facility], [facility] Guide Direct Dial: [phone]  Fax: [phone]

## 2024-01-27 ENCOUNTER — Telehealth: Payer: Self-pay

## 2024-01-27 DIAGNOSIS — I10 Essential (primary) hypertension: Secondary | ICD-10-CM

## 2024-01-31 ENCOUNTER — Other Ambulatory Visit: Payer: Self-pay | Admitting: *Deleted

## 2024-01-31 NOTE — Patient Outreach (Signed)
Care Coordination  01/31/2024  Alisha Morales 1985/04/01 [id number]   Successful RNCM outreach with [name] today. [name] was referred to the MM team for assistance with Case Management. Upon chart review, RNCM made aware of patient's PCP is a [facility] provider. Last visit with PCP 01/20/24 and follow up scheduled 02/24/24. Patient confirms having a [facility] provider and wishes to continue with PCP. RNCM advised [name] to contact [facility] [phone] and provide updated PCP information and request CM services. [name] voiced understanding and denied any urgent needs.   Estanislado Emms RN, BSN [place]  [facility] RN Care Manager [phone]

## 2024-05-14 ENCOUNTER — Telehealth: Payer: Self-pay | Admitting: Emergency Medicine

## 2024-05-14 ENCOUNTER — Ambulatory Visit
Admission: RE | Admit: 2024-05-14 | Discharge: 2024-05-14 | Disposition: A | Discharge status: Home / Self Care | Payer: Self-pay | Source: Ambulatory Visit | Attending: Emergency Medicine | Admitting: Emergency Medicine

## 2024-05-14 ENCOUNTER — Ambulatory Visit (INDEPENDENT_AMBULATORY_CARE_PROVIDER_SITE_OTHER)

## 2024-05-14 VITALS — BP 142/93 | HR 85 | Temp 98.8°F | Resp 20

## 2024-05-14 DIAGNOSIS — M25572 Pain in left ankle and joints of left foot: Secondary | ICD-10-CM

## 2024-05-14 DIAGNOSIS — I10 Essential (primary) hypertension: Secondary | ICD-10-CM | POA: Diagnosis not present

## 2024-05-14 MED ORDER — AMLODIPINE BESYLATE 10 MG PO TABS: 10.0000 mg | ORAL_TABLET | Freq: Every day | ORAL | 2 refills | Status: AC

## 2024-05-14 MED ORDER — IBUPROFEN 800 MG PO TABS: 800.0000 mg | ORAL_TABLET | Freq: Three times a day (TID) | ORAL | 0 refills | Status: AC

## 2024-05-14 NOTE — Telephone Encounter (Signed)
 X-ray results reported via telephone, 2 patient identifiers used

## 2024-05-14 NOTE — Discharge Instructions (Addendum)
 Today you are evaluated for ankle pain   X-rays pending, you will be notified via telephone  Take ibuprofen  800 mg consistently every 8 hours for 3 to 4 days for most effective results, this helps to reduce inflammation and helps to reduce pain  You may use compression wrap as needed to provide stability and support when completing activity  You may apply heat over the affected area 10 to 15-minute intervals  Elevate whenever you are sitting and lying for additional comfort  May continue activity as tolerated  If the symptoms continue to persist she may follow-up with podiatry or orthopedics for further evaluation and management  Today in clinic blood pressure is elevated, not an emergent level but higher than we would like it to be  Stop nifedipine  and begin amlodipine  10 mg daily, you have been given enough medicine for 3 months, please during this timeframe work diligently to find your primary doctor for management  At any point if your blood pressure is elevated and you begin to experience chest pain, shortness of breath, dizziness, lightheadedness, visual disturbance or vomiting please go to the nearest emergency department for immediate evaluation

## 2024-05-14 NOTE — ED Provider Notes (Signed)
 Alisha Morales    CSN: [id number] Arrival date & time: 05/14/24  1602      History   Chief Complaint Chief Complaint  Patient presents with   Ankle Pain    My ankle has not been very swollen but is in constant pain. There is a sharp pain in my ankle more when I wear shoes than not. - Entered by patient    HPI Alisha Morales is a 39 y.o. female.   Presents for evaluation of left ankle pain beginning 2 months ago.  Pain is constant, described as a pounding sensation, can be exacerbated by bearing weight and completing range of motion but able to do so.  Denies injury or precipitating event prior to symptoms beginning.  Has not attempted treatment.  Not currently breast-feeding.  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    Patient Active Problem List   Diagnosis Date Noted   Chronic hypertension 12/02/2023   Postpartum care and examination 12/02/2023   AMA (advanced maternal age) multigravida 35+, third trimester 09/09/2023   Pregnancy with poor obstetric history 09/09/2023   Uterine fibroid in pregnancy 09/09/2023    Past Surgical History:  Procedure Laterality Date   WISDOM TOOTH EXTRACTION     four; age 28    OB History     Gravida  3   Para  2   Term  1   Preterm  1   AB      Living  1      SAB      IAB      Ectopic      Multiple      Live Births  1            Home Medications    Prior to Admission medications   Medication Sig Start Date End Date Taking? Authorizing Provider  amLODipine  (NORVASC ) 10 MG tablet Take 1 tablet (10 mg total) by mouth daily. 05/14/24 08/12/24 Yes Tyshika Baldridge R, NP  ibuprofen  (ADVIL ) 800 MG tablet Take 1 tablet (800 mg total) by mouth 3 (three) times daily. 05/14/24  Yes Devlyn Retter R, NP  NIFEdipine  (PROCARDIA  XL) 30 MG 24 hr tablet Take 1 tablet (30 mg total) by mouth every evening. 11/01/23   Alise Appl, CNM  NIFEdipine   (PROCARDIA  XL) 60 MG 24 hr tablet Take 1 tablet (60 mg total) by mouth in the morning. 11/01/23 10/31/24  Alise Appl, CNM  norethindrone  (MICRONOR ) 0.35 MG tablet Take 1 tablet (0.35 mg total) by mouth daily. 12/02/23   Phylliss Brenner, CNM  Prenatal Vit-Fe Fumarate-FA (MULTIVITAMIN-PRENATAL) 27-0.8 MG TABS tablet Take 1 tablet by mouth daily at 12 noon. Patient not taking: Reported on 11/01/2023    [provider]    Family History Family History  Problem Relation Age of Onset   Stroke Mother    Diabetes Father    Hypercalcemia Father    Hypertension Father    Healthy Sister    Healthy Brother    Healthy Brother    Healthy Brother    Healthy Brother    Healthy Brother    Diabetes Maternal Grandmother    Hypertension Maternal Grandmother    Hypertension Maternal Grandfather    Diabetes Maternal Grandfather    Healthy Paternal Grandmother    Healthy Paternal Grandfather     Social History Social History   Tobacco Use   Smoking status: Never  Smokeless tobacco: Never  Vaping Use   Vaping status: Never Used  Substance Use Topics   Alcohol use: No   Drug use: No     Allergies   Patient has no known allergies.   Review of Systems Review of Systems   Physical Exam Triage Vital Signs ED Triage Vitals [05/14/24 1645]  Encounter Vitals Group     BP (!) 150/100     Systolic BP Percentile      Diastolic BP Percentile      Pulse Rate 85     Resp 20     Temp 98.8 F (37.1 C)     Temp Source Oral     SpO2 96 %     Weight      Height      Head Circumference      Peak Flow      Pain Score      Pain Loc      Pain Education      Exclude from Growth Chart    No data found.  Updated Vital Signs BP (!) 142/93   Pulse 85   Temp 98.8 F (37.1 C) (Oral)   Resp 20   SpO2 96%   Visual Acuity Right Eye Distance:   Left Eye Distance:   Bilateral Distance:    Right Eye Near:   Left Eye Near:    Bilateral Near:     Physical  Exam Constitutional:      Appearance: Normal appearance.  Eyes:     Extraocular Movements: Extraocular movements intact.  Pulmonary:     Effort: Pulmonary effort is normal.  Musculoskeletal:     Comments: Tenderness present to the medial aspect of the left ankle without ecchymosis swelling or deformity, able to bear weight and complete range of motion but pain is elicited with movements, 2+ dorsalis pedis pulse,  Neurological:     Mental Status: She is alert and oriented to person, place, and time. Mental status is at baseline.      UC Treatments / Results  Labs (all labs ordered are listed, but only abnormal results are displayed) Labs Reviewed - No data to display  EKG   Radiology No results found.  Procedures Procedures (including critical care time)  Medications Ordered in UC Medications - No data to display  Initial Impression / Assessment and Plan / UC Course  I have reviewed the triage vital signs and the nursing notes.  Pertinent labs & imaging results that were available during my care of the patient were reviewed by me and considered in my medical decision making (see chart for details).  Acute left ankle pain, elevated blood pressure reading in office with diagnosis of hypertension  X-ray pending, offered compression, declined, declined IM medication and steroids, prescribed ibuprofen  800 mg, recommended supportive care with RICE and given walker referral to orthopedics  Blood pressure on second attempt at 142/93, while not emergent it is elevated, endorsed to nurse that she was no longer taking Procardia  as she was unable to see PCP who is left practice, attempting to reestablish care, Procardia  was given during pregnancy, was prior taking amlodipine , endorses that she has had fluctuations in elevations throughout pregnancy and blood pressure has not been controlled, asymptomatic at this time, amlodipine  restarted, prescribed for 3 months, advised to establish  care during this timeframe, declined assistance Final Clinical Impressions(s) / UC Diagnoses   Final diagnoses:  Acute left ankle pain  Elevated blood pressure reading in office with diagnosis  of hypertension   Discharge Instructions      Today you are evaluated for ankle pain   X-rays pending, you will be notified via telephone  Take ibuprofen  800 mg consistently every 8 hours for 3 to 4 days for most effective results, this helps to reduce inflammation and helps to reduce pain  You may use compression wrap as needed to provide stability and support when completing activity  You may apply heat over the affected area 10 to 15-minute intervals  Elevate whenever you are sitting and lying for additional comfort  May continue activity as tolerated  If the symptoms continue to persist she may follow-up with podiatry or orthopedics for further evaluation and management  Today in clinic blood pressure is elevated, not an emergent level but higher than we would like it to be  Stop nifedipine  and begin amlodipine  10 mg daily, you have been given enough medicine for 3 months, please during this timeframe work diligently to find your primary doctor for management  At any point if your blood pressure is elevated and you begin to experience chest pain, shortness of breath, dizziness, lightheadedness, visual disturbance or vomiting please go to the nearest emergency department for immediate evaluation  ED Prescriptions     Medication Sig Dispense Auth. Provider   ibuprofen  (ADVIL ) 800 MG tablet Take 1 tablet (800 mg total) by mouth 3 (three) times daily. 21 tablet Herberth Deharo R, NP   amLODipine  (NORVASC ) 10 MG tablet Take 1 tablet (10 mg total) by mouth daily. 30 tablet Oscar Hank R, NP      PDMP not reviewed this encounter.   Reena Canning, [place] 05/14/24 [phone]

## 2024-05-15 ENCOUNTER — Ambulatory Visit (HOSPITAL_COMMUNITY): Payer: Self-pay

## 2024-10-20 DIAGNOSIS — D508 Other iron deficiency anemias: Secondary | ICD-10-CM | POA: Diagnosis not present

## 2024-10-20 DIAGNOSIS — Z113 Encounter for screening for infections with a predominantly sexual mode of transmission: Secondary | ICD-10-CM | POA: Diagnosis not present

## 2024-10-20 DIAGNOSIS — Z09 Encounter for follow-up examination after completed treatment for conditions other than malignant neoplasm: Secondary | ICD-10-CM | POA: Diagnosis not present

## 2024-10-20 DIAGNOSIS — R7303 Prediabetes: Secondary | ICD-10-CM | POA: Diagnosis not present

## 2024-10-20 DIAGNOSIS — I10 Essential (primary) hypertension: Secondary | ICD-10-CM | POA: Diagnosis not present

## 2024-10-20 DIAGNOSIS — Z13220 Encounter for screening for lipoid disorders: Secondary | ICD-10-CM | POA: Diagnosis not present

## 2024-10-20 DIAGNOSIS — Z1329 Encounter for screening for other suspected endocrine disorder: Secondary | ICD-10-CM | POA: Diagnosis not present
